# Patient Record
Sex: Male | Born: 1965 | Race: White | Hispanic: No | Marital: Married | Smoking: Current every day smoker
Health system: Southern US, Community
[De-identification: ages and names within clinical notes are randomized; demographics above are authoritative.]

## PROBLEM LIST (undated history)

## (undated) DIAGNOSIS — I10 Essential (primary) hypertension: Secondary | ICD-10-CM

## (undated) DIAGNOSIS — IMO0002 Reserved for concepts with insufficient information to code with codable children: Secondary | ICD-10-CM

## (undated) DIAGNOSIS — N200 Calculus of kidney: Secondary | ICD-10-CM

## (undated) HISTORY — PX: CHOLECYSTECTOMY: SHX55

## (undated) HISTORY — PX: KNEE SURGERY: SHX244

## (undated) HISTORY — PX: BACK SURGERY: SHX140

## (undated) HISTORY — PX: APPENDECTOMY: SHX54

## (undated) HISTORY — PX: BALLOON ANGIOPLASTY, ARTERY: SHX564

---

## 2004-11-16 ENCOUNTER — Emergency Department (HOSPITAL_COMMUNITY): Admission: EM | Admit: 2004-11-16 | Discharge: 2004-11-16 | Payer: Self-pay | Admitting: Emergency Medicine

## 2006-09-05 ENCOUNTER — Emergency Department (HOSPITAL_COMMUNITY): Admission: EM | Admit: 2006-09-05 | Discharge: 2006-09-05 | Payer: Self-pay | Admitting: Emergency Medicine

## 2006-10-14 ENCOUNTER — Emergency Department: Payer: Self-pay | Admitting: Emergency Medicine

## 2007-02-28 ENCOUNTER — Emergency Department (HOSPITAL_COMMUNITY): Admission: EM | Admit: 2007-02-28 | Discharge: 2007-02-28 | Payer: Self-pay | Admitting: Emergency Medicine

## 2007-07-21 ENCOUNTER — Emergency Department (HOSPITAL_COMMUNITY): Admission: EM | Admit: 2007-07-21 | Discharge: 2007-07-21 | Payer: Self-pay | Admitting: Emergency Medicine

## 2009-05-06 ENCOUNTER — Emergency Department (HOSPITAL_COMMUNITY): Admission: EM | Admit: 2009-05-06 | Discharge: 2009-05-07 | Payer: Self-pay | Admitting: Emergency Medicine

## 2009-08-01 ENCOUNTER — Emergency Department (HOSPITAL_COMMUNITY): Admission: EM | Admit: 2009-08-01 | Discharge: 2009-08-01 | Payer: Self-pay | Admitting: Emergency Medicine

## 2009-08-31 ENCOUNTER — Emergency Department (HOSPITAL_COMMUNITY): Admission: EM | Admit: 2009-08-31 | Discharge: 2009-09-01 | Payer: Self-pay | Admitting: Emergency Medicine

## 2009-11-09 ENCOUNTER — Emergency Department (HOSPITAL_COMMUNITY): Admission: EM | Admit: 2009-11-09 | Discharge: 2009-11-09 | Payer: Self-pay | Admitting: Emergency Medicine

## 2010-03-11 ENCOUNTER — Inpatient Hospital Stay (HOSPITAL_COMMUNITY): Admission: EM | Admit: 2010-03-11 | Discharge: 2010-03-12 | Payer: Self-pay | Admitting: Emergency Medicine

## 2010-03-12 ENCOUNTER — Encounter (INDEPENDENT_AMBULATORY_CARE_PROVIDER_SITE_OTHER): Payer: Self-pay | Admitting: Cardiology

## 2010-03-12 ENCOUNTER — Ambulatory Visit: Payer: Self-pay | Admitting: Vascular Surgery

## 2010-03-16 ENCOUNTER — Emergency Department (HOSPITAL_COMMUNITY): Admission: EM | Admit: 2010-03-16 | Discharge: 2010-03-16 | Payer: Self-pay | Admitting: Emergency Medicine

## 2010-04-04 ENCOUNTER — Emergency Department: Payer: Self-pay | Admitting: Internal Medicine

## 2010-11-08 NOTE — Discharge Summary (Signed)
NAME:  Mark Flowers, Mark Flowers NO.:  [id number]  MEDICAL RECORD NO.:  [id number]           PATIENT TYPE:  LOCATION:                                 FACILITY:  PHYSICIAN:  Thereasa Solo. Little, M.D. DATE OF BIRTH:  Feb 26, 1966  DATE OF ADMISSION:  03/11/2010 DATE OF DISCHARGE:  03/12/2010                              DISCHARGE SUMMARY   DISCHARGE DIAGNOSES: 1. Chest pain, negative myocardial infarction.     a.     Patent coronary arteries. 2. Hypertension controlled. 3. Dyslipidemia. 4. Abnormal EKG. 5. Strong family history of coronary artery disease. 6. Negative venous Dopplers for deep vein thrombosis. 7. Tobacco abuse.  DISCHARGE INSTRUCTIONS:  Increase activity slowly.  May shower.  Follow up with Dr. Garen Lah, office will call.  No change in home medications.  HISTORY OF PRESENT ILLNESS:  A 45 year old male was admitted by [facility] and during hospitalization was transferred to [facility].  He had history of chronic pain, both upper and lower back pain with a history of obstructive sleep apnea on CPAP and ongoing tobacco abuse.  He presented to the ER with complaints of chest tightness which have been going off and on for the last 4-5 days that will come at rest as well as with exertion.  Because it continued, he decided to come to the emergency room.  He was in sinus tach.  He was given IV Dilaudid for his pain.  CT angio of the chest showed no acute process, no PE and he was admitted for observation.  Because of the chest pain, it was decided to do a cardiac catheterization.  Past medical history was positive for hypertension, hyperlipidemia, he was on no medication and obstructive sleep apnea which he has CPAP for.  PAST SURGICAL HISTORY:  Cervical spine surgery.  He only takes p.r.n. medication.  MEDICATIONS:  Significant for coronary artery bypass graft in both parents and a sister.  The patient continues to  smoke.  The patient was admitted and cardiac consult was obtained with [facility], was seen by Dr. Garen Lah originally, was placed on heparin and Integrilin.  He had abnormal EKG and underwent cardiac cath on March 12, 2010.  He had smoking cessation consult also during hospitalization.  Cardiac catheterization revealed no acute process, nonobstructive disease and he was felt stable for discharge.  The patient also received Nutrition diet education for his coronary artery disease.  LABORATORY DATA:  At discharge; hemoglobin 13.6, hematocrit 41.2, platelets 235, WBC 9.3, neutrophils 58, lymphs 27, mono 10, eos 4, baso 1.  Protime 1.9, INR 0.88, PTT 25.  Chemistries; sodium 139, potassium 3.9, chloride 106, CO2 29, glucose 88, BUN 13, creatinine 1.33, glycohemoglobin 5.9.  Cardiac enzymes were negative ranging 96-118 with MB 1.7-1.9 and troponin-I 0.01-0.02.  Total cholesterol was 177, triglycerides 159, HDL 43 and LDL 102.  His drug screen was positive for benzodiazepines and opiates.  This may have been after his Dilaudid had been given and he does have chronic back pain.  CT of his chest done on March 11, 2010, no evidence of TEE, no evidence  of aortic dissection, few scattered blebs noted within the lungs with mild dependence of subsegmental atelectasis, tiny nodular densities in the periphery of both lungs measuring up to 3 mm in size.  Followup CT in 1 year was recommended  Portable chest x-ray, no acute cardiopulmonary process was seen.  EKG, sinus tach with a T-wave in V2.     Darcella Gasman. Ingold, N.P.   ______________________________ Thereasa Solo Little, M.D.    LRI/MEDQ  D:  10/31/2010  T:  10/31/2010  Job:  [id number]  cc:   Thereasa Solo. Little, M.D.  Electronically Signed by Nada Boozer N.P. on 11/05/2010 05:40:23 PM Electronically Signed by Julieanne Manson M.D. on 11/08/2010 02:26:18 PM

## 2010-11-16 LAB — T4, FREE: Free T4: 1.16 ng/dL (ref 0.80–1.80)

## 2010-11-16 LAB — CBC
Hemoglobin: 16.5 g/dL (ref 13.0–17.0)
MCH: 31.5 pg (ref 26.0–34.0)
MCV: 91.7 fL (ref 78.0–100.0)
Platelets: 273 10*3/uL (ref 150–400)

## 2010-11-16 LAB — DIFFERENTIAL
Basophils Relative: 2 % — ABNORMAL HIGH (ref 0–1)
Eosinophils Absolute: 0.2 10*3/uL (ref 0.0–0.7)
Monocytes Absolute: 1 10*3/uL (ref 0.1–1.0)
Monocytes Relative: 8 % (ref 3–12)
Neutrophils Relative %: 69 % (ref 43–77)

## 2010-11-16 LAB — POCT CARDIAC MARKERS: CKMB, poc: <1 ng/mL — ABNORMAL LOW (ref 1.0–8.0)

## 2010-11-16 LAB — COMPREHENSIVE METABOLIC PANEL
ALT: 33 U/L (ref 0–53)
AST: 24 U/L (ref 0–37)
Albumin: 4.1 g/dL (ref 3.5–5.2)
Creatinine, Ser: 1.42 mg/dL (ref 0.4–1.5)
GFR calc Af Amer: >60 mL/min (ref >60)
GFR calc non Af Amer: 55 mL/min — ABNORMAL LOW (ref >60)
Glucose, Bld: 212 mg/dL — ABNORMAL HIGH (ref 70–99)
Total Protein: 7 g/dL (ref 6.0–8.3)

## 2010-11-17 LAB — TROPONIN I: Troponin I: 0.01 ng/mL (ref 0.00–0.06)

## 2010-11-17 LAB — CBC
HCT: 41.2 % (ref 39.0–52.0)
HCT: 42.8 % (ref 39.0–52.0)
Hemoglobin: 13.6 g/dL (ref 13.0–17.0)
Hemoglobin: 14.6 g/dL (ref 13.0–17.0)
MCH: 30.2 pg (ref 26.0–34.0)
MCV: 90.9 fL (ref 78.0–100.0)
MCV: 91.8 fL (ref 78.0–100.0)
RBC: 4.49 MIL/uL (ref 4.22–5.81)
WBC: 9.2 10*3/uL (ref 4.0–10.5)

## 2010-11-17 LAB — BASIC METABOLIC PANEL
CO2: 29 mEq/L (ref 19–32)
Calcium: 8.7 mg/dL (ref 8.4–10.5)
Chloride: 108 mEq/L (ref 96–112)
GFR calc non Af Amer: >60 mL/min (ref >60)
Glucose, Bld: 149 mg/dL — ABNORMAL HIGH (ref 70–99)
Glucose, Bld: 88 mg/dL (ref 70–99)
Potassium: 3.7 mEq/L (ref 3.5–5.1)
Sodium: 139 mEq/L (ref 135–145)
Sodium: 139 mEq/L (ref 135–145)

## 2010-11-17 LAB — POCT I-STAT, CHEM 8
Calcium, Ion: 1.06 mmol/L — ABNORMAL LOW (ref 1.12–1.32)
Creatinine, Ser: 1.3 mg/dL (ref 0.4–1.5)
Glucose, Bld: 184 mg/dL — ABNORMAL HIGH (ref 70–99)
HCT: 49 % (ref 39.0–52.0)
Hemoglobin: 16.7 g/dL (ref 13.0–17.0)
Potassium: 3.1 mEq/L — ABNORMAL LOW (ref 3.5–5.1)
TCO2: 25 mmol/L (ref 0–100)

## 2010-11-17 LAB — DIFFERENTIAL
Eosinophils Absolute: 0.3 10*3/uL (ref 0.0–0.7)
Eosinophils Relative: 4 % (ref 0–5)
Lymphocytes Relative: 25 % (ref 12–46)
Lymphs Abs: 2.5 10*3/uL (ref 0.7–4.0)
Monocytes Absolute: 0.8 10*3/uL (ref 0.1–1.0)
Monocytes Relative: 10 % (ref 3–12)
Monocytes Relative: 9 % (ref 3–12)
Neutro Abs: 5.7 10*3/uL (ref 1.7–7.7)
Neutrophils Relative %: 58 % (ref 43–77)

## 2010-11-17 LAB — POCT CARDIAC MARKERS
CKMB, poc: <1 ng/mL — ABNORMAL LOW (ref 1.0–8.0)
CKMB, poc: <1 ng/mL — ABNORMAL LOW (ref 1.0–8.0)
Myoglobin, poc: 34.6 ng/mL (ref 12–200)
Troponin i, poc: <0.05 ng/mL (ref 0.00–0.09)

## 2010-11-17 LAB — LIPID PANEL
HDL: 43 mg/dL (ref >39)
Triglycerides: 159 mg/dL — ABNORMAL HIGH (ref <150)
VLDL: 32 mg/dL (ref 0–40)

## 2010-11-17 LAB — GLUCOSE, CAPILLARY
Glucose-Capillary: 110 mg/dL — ABNORMAL HIGH (ref 70–99)
Glucose-Capillary: 118 mg/dL — ABNORMAL HIGH (ref 70–99)
Glucose-Capillary: 118 mg/dL — ABNORMAL HIGH (ref 70–99)
Glucose-Capillary: 94 mg/dL (ref 70–99)

## 2010-11-17 LAB — HEPARIN LEVEL (UNFRACTIONATED): Heparin Unfractionated: 0.35 IU/mL (ref 0.30–0.70)

## 2010-11-17 LAB — RAPID URINE DRUG SCREEN, HOSP PERFORMED
Cocaine: NOT DETECTED
Opiates: POSITIVE — AB
Tetrahydrocannabinol: NOT DETECTED

## 2010-11-17 LAB — CARDIAC PANEL(CRET KIN+CKTOT+MB+TROPI)
CK, MB: 1.6 ng/mL (ref 0.3–4.0)
CK, MB: 1.9 ng/mL (ref 0.3–4.0)
Relative Index: 1.4 (ref 0.0–2.5)

## 2010-11-17 LAB — CK TOTAL AND CKMB (NOT AT ARMC)
CK, MB: 1.7 ng/mL (ref 0.3–4.0)
Relative Index: INVALID (ref 0.0–2.5)
Total CK: 96 U/L (ref 7–232)

## 2010-11-24 LAB — D-DIMER, QUANTITATIVE: D-Dimer, Quant: <0.22 ug/mL-FEU (ref 0.00–0.48)

## 2010-11-24 LAB — DIFFERENTIAL
Basophils Absolute: 0.2 10*3/uL — ABNORMAL HIGH (ref 0.0–0.1)
Eosinophils Absolute: 0.4 10*3/uL (ref 0.0–0.7)
Eosinophils Relative: 3 % (ref 0–5)

## 2010-11-24 LAB — CBC
HCT: 50.1 % (ref 39.0–52.0)
MCHC: 32.4 g/dL (ref 30.0–36.0)
MCV: 92.8 fL (ref 78.0–100.0)
Platelets: 300 10*3/uL (ref 150–400)
RDW: 13.2 % (ref 11.5–15.5)

## 2010-11-24 LAB — POCT I-STAT, CHEM 8
BUN: 19 mg/dL (ref 6–23)
Calcium, Ion: 1.13 mmol/L (ref 1.12–1.32)
Creatinine, Ser: 1.3 mg/dL (ref 0.4–1.5)
TCO2: 22 mmol/L (ref 0–100)

## 2010-12-06 LAB — CBC
Hemoglobin: 16.4 g/dL (ref 13.0–17.0)
MCHC: 34.7 g/dL (ref 30.0–36.0)
MCV: 91.2 fL (ref 78.0–100.0)
RDW: 14 % (ref 11.5–15.5)

## 2010-12-06 LAB — POCT CARDIAC MARKERS
CKMB, poc: <1 ng/mL — ABNORMAL LOW (ref 1.0–8.0)
CKMB, poc: <1 ng/mL — ABNORMAL LOW (ref 1.0–8.0)
Myoglobin, poc: 60.1 ng/mL (ref 12–200)
Troponin i, poc: <0.05 ng/mL (ref 0.00–0.09)

## 2010-12-06 LAB — BASIC METABOLIC PANEL
CO2: 23 mEq/L (ref 19–32)
Calcium: 9.4 mg/dL (ref 8.4–10.5)
Chloride: 105 mEq/L (ref 96–112)
Creatinine, Ser: 1.39 mg/dL (ref 0.4–1.5)
Glucose, Bld: 134 mg/dL — ABNORMAL HIGH (ref 70–99)
Sodium: 139 mEq/L (ref 135–145)

## 2010-12-06 LAB — DIFFERENTIAL
Basophils Absolute: 0.1 10*3/uL (ref 0.0–0.1)
Basophils Relative: 1 % (ref 0–1)
Eosinophils Absolute: 0.1 10*3/uL (ref 0.0–0.7)
Monocytes Absolute: 0.9 10*3/uL (ref 0.1–1.0)
Neutro Abs: 8.5 10*3/uL — ABNORMAL HIGH (ref 1.7–7.7)

## 2011-06-10 LAB — URINE MICROSCOPIC-ADD ON

## 2011-06-10 LAB — URINALYSIS, ROUTINE W REFLEX MICROSCOPIC
Leukocytes, UA: NEGATIVE
Nitrite: NEGATIVE
Protein, ur: NEGATIVE
Specific Gravity, Urine: 1.01
Urobilinogen, UA: 0.2

## 2011-06-18 LAB — URINALYSIS, ROUTINE W REFLEX MICROSCOPIC
Bilirubin Urine: NEGATIVE
Ketones, ur: NEGATIVE
Leukocytes, UA: NEGATIVE
Nitrite: NEGATIVE
Protein, ur: NEGATIVE
Urobilinogen, UA: 1

## 2011-07-14 ENCOUNTER — Emergency Department (HOSPITAL_COMMUNITY)
Admission: EM | Admit: 2011-07-14 | Discharge: 2011-07-15 | Disposition: A | Discharge status: Home / Self Care | Payer: Medicaid Other | Attending: Emergency Medicine | Admitting: Emergency Medicine

## 2011-07-14 ENCOUNTER — Encounter: Payer: Self-pay | Admitting: Emergency Medicine

## 2011-07-14 DIAGNOSIS — R109 Unspecified abdominal pain: Secondary | ICD-10-CM | POA: Insufficient documentation

## 2011-07-14 DIAGNOSIS — I10 Essential (primary) hypertension: Secondary | ICD-10-CM | POA: Insufficient documentation

## 2011-07-14 DIAGNOSIS — E119 Type 2 diabetes mellitus without complications: Secondary | ICD-10-CM | POA: Insufficient documentation

## 2011-07-14 DIAGNOSIS — Z87442 Personal history of urinary calculi: Secondary | ICD-10-CM | POA: Insufficient documentation

## 2011-07-14 DIAGNOSIS — R112 Nausea with vomiting, unspecified: Secondary | ICD-10-CM | POA: Insufficient documentation

## 2011-07-14 DIAGNOSIS — R319 Hematuria, unspecified: Secondary | ICD-10-CM | POA: Insufficient documentation

## 2011-07-14 HISTORY — DX: Essential (primary) hypertension: I10

## 2011-07-14 HISTORY — DX: Calculus of kidney: N20.0

## 2011-07-14 LAB — URINALYSIS, ROUTINE W REFLEX MICROSCOPIC
Bilirubin Urine: NEGATIVE
Nitrite: NEGATIVE
Specific Gravity, Urine: 1.019 (ref 1.005–1.030)
Urobilinogen, UA: 0.2 mg/dL (ref 0.0–1.0)
pH: 6 (ref 5.0–8.0)

## 2011-07-14 LAB — URINE MICROSCOPIC-ADD ON

## 2011-07-14 MED ORDER — HYDROMORPHONE HCL PF 1 MG/ML IJ SOLN
0.5000 mg | Freq: Once | INTRAMUSCULAR | Status: AC
  Administered 2011-07-15: 0.5 mg via INTRAVENOUS
  Filled 2011-07-14: qty 1

## 2011-07-14 MED ORDER — ONDANSETRON HCL 4 MG/2ML IJ SOLN
4.0000 mg | Freq: Once | INTRAMUSCULAR | Status: AC
  Administered 2011-07-15: 4 mg via INTRAVENOUS
  Filled 2011-07-14: qty 2

## 2011-07-14 NOTE — ED Notes (Signed)
PT. REPORTS RIGHT FLANK PAIN X 2 DAYS ,DENIES INJURY OR FALL, SLIGHT DYSURIA /NO HEMATURIA . NO FEVER OR CHILLS , VOMITTED X2 .

## 2011-07-14 NOTE — ED Notes (Signed)
Pt c/o right flank pain onset 2 days ago.  St's pain increased today.  Pt pacing in hallway

## 2011-07-15 ENCOUNTER — Emergency Department (HOSPITAL_COMMUNITY): Payer: Medicaid Other

## 2011-07-15 ENCOUNTER — Encounter (HOSPITAL_COMMUNITY): Payer: Self-pay | Admitting: Radiology

## 2011-07-15 MED ORDER — HYDROMORPHONE HCL PF 1 MG/ML IJ SOLN
1.0000 mg | Freq: Once | INTRAMUSCULAR | Status: AC
  Administered 2011-07-15: 1 mg via INTRAVENOUS
  Filled 2011-07-15: qty 1

## 2011-07-15 MED ORDER — OXYCODONE-ACETAMINOPHEN 5-325 MG PO TABS: 2.0000 | ORAL_TABLET | ORAL | Status: AC | PRN

## 2011-07-15 MED ORDER — PROMETHAZINE HCL 25 MG RE SUPP: 25.0000 mg | Freq: Four times a day (QID) | RECTAL | Status: AC | PRN

## 2011-07-15 MED ORDER — KETOROLAC TROMETHAMINE 30 MG/ML IJ SOLN
30.0000 mg | Freq: Once | INTRAMUSCULAR | Status: AC
  Administered 2011-07-15: 30 mg via INTRAVENOUS
  Filled 2011-07-15: qty 1

## 2011-07-15 MED ORDER — PROMETHAZINE HCL 25 MG PO TABS: 25.0000 mg | ORAL_TABLET | Freq: Four times a day (QID) | ORAL | Status: AC | PRN

## 2011-07-15 MED ORDER — ONDANSETRON HCL 4 MG PO TABS: 4.0000 mg | ORAL_TABLET | Freq: Four times a day (QID) | ORAL | Status: AC

## 2011-07-15 NOTE — ED Provider Notes (Signed)
History     CSN: [id number] Arrival date & time: 07/14/2011  7:34 PM   First MD Initiated Contact with Patient 07/14/11 2332      Chief Complaint  Patient presents with  . Flank Pain    (Consider location/radiation/quality/duration/timing/severity/associated sxs/prior treatment) HPI 45 year old male presents to emergency room with complaint of right flank pain for last 2 days. Patient denies any injury or fall, pain not reproducible with palpation or movement. Pain is similar to prior kidney stone pain. Patient has had nausea and vomiting with this pain when it is severe. Patient is taking ibuprofen without improvement in pain no fevers. No pain with urination but has noted dark discoloration to his urine. Past Medical History  Diagnosis Date  . Kidney calculi   . Diabetes mellitus   . Hypertension     Past Surgical History  Procedure Date  . Back surgery   . Appendectomy   . Cholecystectomy   . Balloon angioplasty, artery     No family history on file.  History  Substance Use Topics  . Smoking status: Current Everyday Smoker  . Smokeless tobacco: Not on file  . Alcohol Use: No      Review of Systems  Constitutional: Negative.   HENT: Negative.   Eyes: Negative.   Respiratory: Negative.   Cardiovascular: Negative.   Gastrointestinal: Negative.   Genitourinary: Negative.   Musculoskeletal: Negative.   Skin: Negative.   Neurological: Negative.   Hematological: Negative.   Psychiatric/Behavioral: Negative.   All other systems reviewed and are negative.    Allergies  Bee venom; Morphine and related; and Flexeril  Home Medications   Current Outpatient Rx  Name Route Sig Dispense Refill  . ASPIRIN 81 MG PO TABS Oral Take 81 mg by mouth every morning.      . CHOLESTYRAMINE 4 G PO PACK Oral Take 1 packet by mouth daily.      Marland Kitchen GABAPENTIN 300 MG PO CAPS Oral Take 300-600 mg by mouth 3 (three) times daily. The range depends on the amount of pain.     Marland Kitchen  GLIMEPIRIDE 2 MG PO TABS Oral Take 2 mg by mouth 3 (three) times daily.      . IBUPROFEN 800 MG PO TABS Oral Take 800 mg by mouth every 6 (six) hours as needed. For pain.     Marland Kitchen OMEPRAZOLE 20 MG PO CPDR Oral Take 20 mg by mouth 2 (two) times daily.      Marland Kitchen TIZANIDINE HCL 4 MG PO TABS Oral Take 2-4 mg by mouth at bedtime.        BP 119/94  Pulse 100  Temp(Src) 97.6 F (36.4 C) (Oral)  Resp 20  SpO2 97%  Physical Exam  Nursing note and vitals reviewed. Constitutional: He is oriented to person, place, and time. He appears well-developed and well-nourished.       Uncomfortable appearing, pacing  HENT:  Head: Normocephalic and atraumatic.  Right Ear: External ear normal.  Left Ear: External ear normal.  Nose: Nose normal.  Mouth/Throat: Oropharynx is clear and moist.  Eyes: Conjunctivae and EOM are normal. Pupils are equal, round, and reactive to light.  Neck: Normal range of motion. Neck supple. No JVD present. No tracheal deviation present. No thyromegaly present.  Cardiovascular: Normal rate, regular rhythm, normal heart sounds and intact distal pulses.  Exam reveals no gallop and no friction rub.   No murmur heard. Pulmonary/Chest: Effort normal and breath sounds normal. No stridor. No respiratory distress. He has  no wheezes. He has no rales. He exhibits no tenderness.  Abdominal: Soft. Bowel sounds are normal. He exhibits no distension and no mass. There is no tenderness. There is no rebound and no guarding.       CVA tenderness on right  Musculoskeletal: Normal range of motion. He exhibits no edema and no tenderness.  Lymphadenopathy:    He has no cervical adenopathy.  Neurological: He is oriented to person, place, and time. He has normal reflexes. No cranial nerve deficit. He exhibits normal muscle tone. Coordination normal.  Skin: Skin is dry. No rash noted. No erythema. No pallor.  Psychiatric: He has a normal mood and affect. His behavior is normal. Judgment and thought content  normal.    ED Course  Procedures (including critical care time)  Labs Reviewed  URINALYSIS, ROUTINE W REFLEX MICROSCOPIC - Abnormal; Notable for the following:    Hgb urine dipstick LARGE (*)    All other components within normal limits  URINE MICROSCOPIC-ADD ON   Ct Abdomen Pelvis Wo Contrast  07/15/2011  *RADIOLOGY REPORT*  Clinical Data: 45 year old male with right flank pain.  Vomiting.  CT ABDOMEN AND PELVIS WITHOUT CONTRAST  Technique:  Multidetector CT imaging of the abdomen and pelvis was performed following the standard protocol without intravenous contrast.  Comparison: 02/28/2007.  Findings: Mild right basilar atelectasis. No acute osseous abnormality identified.  No pelvic free fluid.  Negative distal colon.  Large and small bowel loops largely are decompressed. Appendix not identified, may be surgically absent.  Gallbladder surgically absent.  Negative noncontrast stomach, duodenum, liver, spleen, pancreas, and adrenal glands.  Punctate bilateral nephrolithiasis.  No hydronephrosis, perinephric stranding, or hydroureter.  Normal course of the right ureter.  No abdominal free fluid.  IMPRESSION: Punctate bilateral nephrolithiasis without urologic obstruction or other acute finding in the abdomen or pelvis.  Original Report Authenticated By: Harley Hallmark, M.D.        MDM  45 year old male with right flank pain and hematuria, CT scan with punctate bilateral nephrolithiasis but no signs of acute kidney stone or hydronephrosis. Patient feeling better after pain medication. Patient to follow up with urology for further workup of his hematuria and flank pain        Olivia Mackie, MD 07/15/11 [phone]

## 2011-07-15 NOTE — ED Notes (Signed)
Pain med given, pt waiting for CT.  Pt undressed and placed in gown.

## 2011-08-22 ENCOUNTER — Emergency Department (HOSPITAL_COMMUNITY)
Admission: EM | Admit: 2011-08-22 | Discharge: 2011-08-22 | Disposition: A | Discharge status: Home / Self Care | Payer: Medicaid Other | Attending: Emergency Medicine | Admitting: Emergency Medicine

## 2011-08-22 ENCOUNTER — Emergency Department (HOSPITAL_COMMUNITY): Payer: Medicaid Other

## 2011-08-22 ENCOUNTER — Encounter (HOSPITAL_COMMUNITY): Payer: Self-pay | Admitting: Emergency Medicine

## 2011-08-22 DIAGNOSIS — R110 Nausea: Secondary | ICD-10-CM | POA: Insufficient documentation

## 2011-08-22 DIAGNOSIS — R319 Hematuria, unspecified: Secondary | ICD-10-CM | POA: Insufficient documentation

## 2011-08-22 DIAGNOSIS — Z7982 Long term (current) use of aspirin: Secondary | ICD-10-CM | POA: Insufficient documentation

## 2011-08-22 DIAGNOSIS — E119 Type 2 diabetes mellitus without complications: Secondary | ICD-10-CM | POA: Insufficient documentation

## 2011-08-22 DIAGNOSIS — R1032 Left lower quadrant pain: Secondary | ICD-10-CM | POA: Insufficient documentation

## 2011-08-22 DIAGNOSIS — R61 Generalized hyperhidrosis: Secondary | ICD-10-CM | POA: Insufficient documentation

## 2011-08-22 DIAGNOSIS — IMO0002 Reserved for concepts with insufficient information to code with codable children: Secondary | ICD-10-CM | POA: Insufficient documentation

## 2011-08-22 DIAGNOSIS — Z9889 Other specified postprocedural states: Secondary | ICD-10-CM | POA: Insufficient documentation

## 2011-08-22 DIAGNOSIS — R10819 Abdominal tenderness, unspecified site: Secondary | ICD-10-CM | POA: Insufficient documentation

## 2011-08-22 DIAGNOSIS — Z79899 Other long term (current) drug therapy: Secondary | ICD-10-CM | POA: Insufficient documentation

## 2011-08-22 DIAGNOSIS — I10 Essential (primary) hypertension: Secondary | ICD-10-CM | POA: Insufficient documentation

## 2011-08-22 DIAGNOSIS — R197 Diarrhea, unspecified: Secondary | ICD-10-CM | POA: Insufficient documentation

## 2011-08-22 HISTORY — DX: Reserved for concepts with insufficient information to code with codable children: IMO0002

## 2011-08-22 LAB — BASIC METABOLIC PANEL
BUN: 12 mg/dL (ref 6–23)
Calcium: 9.3 mg/dL (ref 8.4–10.5)
Chloride: 103 mEq/L (ref 96–112)
Creatinine, Ser: 1.11 mg/dL (ref 0.50–1.35)
GFR calc Af Amer: >90 mL/min (ref >90)
GFR calc non Af Amer: 80 mL/min — ABNORMAL LOW (ref >90)

## 2011-08-22 LAB — CBC
HCT: 45.6 % (ref 39.0–52.0)
Hemoglobin: 15.6 g/dL (ref 13.0–17.0)
MCH: 30.5 pg (ref 26.0–34.0)
MCHC: 34.2 g/dL (ref 30.0–36.0)
RDW: 13.8 % (ref 11.5–15.5)

## 2011-08-22 LAB — URINALYSIS, ROUTINE W REFLEX MICROSCOPIC
Nitrite: NEGATIVE
Protein, ur: NEGATIVE mg/dL
Specific Gravity, Urine: 1.016 (ref 1.005–1.030)
Urobilinogen, UA: 0.2 mg/dL (ref 0.0–1.0)

## 2011-08-22 LAB — DIFFERENTIAL
Basophils Absolute: 0.1 10*3/uL (ref 0.0–0.1)
Basophils Relative: 1 % (ref 0–1)
Eosinophils Absolute: 0.1 10*3/uL (ref 0.0–0.7)
Monocytes Absolute: 0.8 10*3/uL (ref 0.1–1.0)
Monocytes Relative: 7 % (ref 3–12)
Neutro Abs: 7.5 10*3/uL (ref 1.7–7.7)
Neutrophils Relative %: 71 % (ref 43–77)

## 2011-08-22 LAB — URINE MICROSCOPIC-ADD ON

## 2011-08-22 MED ORDER — SODIUM CHLORIDE 0.9 % IV BOLUS (SEPSIS)
1000.0000 mL | Freq: Once | INTRAVENOUS | Status: AC
  Administered 2011-08-22: 1000 mL via INTRAVENOUS

## 2011-08-22 MED ORDER — ONDANSETRON HCL 4 MG/2ML IJ SOLN
4.0000 mg | Freq: Once | INTRAMUSCULAR | Status: AC
  Administered 2011-08-22: 4 mg via INTRAVENOUS
  Filled 2011-08-22: qty 2

## 2011-08-22 MED ORDER — METRONIDAZOLE 500 MG PO TABS: 500.0000 mg | ORAL_TABLET | Freq: Two times a day (BID) | ORAL | Status: AC

## 2011-08-22 MED ORDER — CIPROFLOXACIN HCL 500 MG PO TABS: 500.0000 mg | ORAL_TABLET | Freq: Two times a day (BID) | ORAL | Status: AC

## 2011-08-22 MED ORDER — HYDROMORPHONE HCL PF 1 MG/ML IJ SOLN
1.0000 mg | Freq: Once | INTRAMUSCULAR | Status: AC
  Administered 2011-08-22: 1 mg via INTRAVENOUS
  Filled 2011-08-22: qty 1

## 2011-08-22 NOTE — ED Provider Notes (Signed)
History     CSN: [id number]  Arrival date & time 08/22/11  1704   First MD Initiated Contact with Patient 08/22/11 1959      Chief Complaint  Patient presents with  . Abdominal Pain    rlq    (Consider location/radiation/quality/duration/timing/severity/associated sxs/prior treatment) HPI Comments: Patient here with acute onset of RLQ abdominal pain that intially started yesterday, states that during the night, this pain eased off, then returned again suddenly today at 3pm - he states that when the pain is at it's worse he is sweaty and nauseous.  States had diarrhea about 2 days ago - but reports normal bowel movements since then.  Denies fever, chills, blood in urine, pain with urination.  Patient is a 45 y.o. male presenting with abdominal pain. The history is provided by the patient. No language interpreter was used.  Abdominal Pain The primary symptoms of the illness include abdominal pain and nausea. The primary symptoms of the illness do not include fever, shortness of breath, vomiting, diarrhea, hematemesis, hematochezia or dysuria. The current episode started yesterday. The onset of the illness was sudden. The problem has not changed since onset. The patient states that she believes she is currently not pregnant. The patient has not had a change in bowel habit. Additional symptoms associated with the illness include diaphoresis. Symptoms associated with the illness do not include chills, anorexia, heartburn, constipation, urgency, hematuria, frequency or back pain.    Past Medical History  Diagnosis Date  . Kidney calculi   . Diabetes mellitus   . Hypertension   . Degenerative disc disease     Past Surgical History  Procedure Date  . Back surgery   . Appendectomy   . Cholecystectomy   . Balloon angioplasty, artery     No family history on file.  History  Substance Use Topics  . Smoking status: Current Everyday Smoker -- 1.0 packs/day  . Smokeless tobacco: Not on  file  . Alcohol Use: No      Review of Systems  Constitutional: Positive for diaphoresis. Negative for fever and chills.  Respiratory: Negative for shortness of breath.   Gastrointestinal: Positive for nausea and abdominal pain. Negative for heartburn, vomiting, diarrhea, constipation, hematochezia, anorexia and hematemesis.  Genitourinary: Negative for dysuria, urgency, frequency and hematuria.  Musculoskeletal: Negative for back pain.  All other systems reviewed and are negative.    Allergies  Bee venom; Morphine and related; and Flexeril  Home Medications   Current Outpatient Rx  Name Route Sig Dispense Refill  . ASPIRIN 81 MG PO TABS Oral Take 81 mg by mouth every morning.      Marland Kitchen DICLOFENAC SODIUM 75 MG PO TBEC Oral Take 75 mg by mouth daily.      Marland Kitchen DILTIAZEM HCL ER COATED BEADS 120 MG PO CP24 Oral Take 120 mg by mouth daily.      Marland Kitchen GABAPENTIN 300 MG PO CAPS Oral Take 600 mg by mouth 4 (four) times daily - after meals and at bedtime. The range depends on the amount of pain.    Marland Kitchen GLIMEPIRIDE 2 MG PO TABS Oral Take 2 mg by mouth 2 (two) times daily.     Marland Kitchen HYDROCODONE-ACETAMINOPHEN 10-500 MG PO TABS Oral Take 1 tablet by mouth every 6 (six) hours as needed. pain     . IBUPROFEN 800 MG PO TABS Oral Take 800 mg by mouth every 6 (six) hours as needed. For pain.     Marland Kitchen OMEPRAZOLE 20 MG PO CPDR  Oral Take 20 mg by mouth 2 (two) times daily.      . SUMATRIPTAN SUCCINATE 50 MG PO TABS Oral Take 50 mg by mouth every 2 (two) hours as needed. migraine       BP 159/97  Pulse 105  Temp(Src) 98.2 F (36.8 C) (Oral)  Resp 20  Wt 296 lb (134.265 kg)  SpO2 100%  Physical Exam  Nursing note and vitals reviewed. Constitutional: He is oriented to person, place, and time. He appears well-developed and well-nourished. No distress.  HENT:  Head: Normocephalic and atraumatic.  Right Ear: External ear normal.  Left Ear: External ear normal.  Mouth/Throat: No oropharyngeal exudate.  Eyes:  Conjunctivae are normal. Pupils are equal, round, and reactive to light. No scleral icterus.  Neck: Normal range of motion. Neck supple.  Cardiovascular: Normal rate, regular rhythm and normal heart sounds.  Exam reveals no gallop and no friction rub.   No murmur heard. Pulmonary/Chest: Effort normal and breath sounds normal. No respiratory distress.  Abdominal: Soft. Bowel sounds are normal. He exhibits no distension. There is tenderness. There is no rebound, no guarding and no CVA tenderness.  Musculoskeletal: Normal range of motion.  Neurological: He is alert and oriented to person, place, and time. No cranial nerve deficit.  Skin: Skin is warm and dry.  Psychiatric: He has a normal mood and affect. His behavior is normal. Judgment and thought content normal.    ED Course  Procedures (including critical care time)  Labs Reviewed  URINALYSIS, ROUTINE W REFLEX MICROSCOPIC - Abnormal; Notable for the following:    Hgb urine dipstick MODERATE (*)    All other components within normal limits  URINE MICROSCOPIC-ADD ON  CBC  DIFFERENTIAL  BASIC METABOLIC PANEL   Ct Abdomen Pelvis Wo Contrast  08/22/2011  *RADIOLOGY REPORT*  Clinical Data: Right abdominal groin pain, nausea, history kidney stones, diabetes, hypertension, appendectomy, back surgery, cholecystectomy  CT ABDOMEN AND PELVIS WITHOUT CONTRAST  Technique:  Multidetector CT imaging of the abdomen and pelvis was performed following the standard protocol without intravenous contrast. Sagittal and coronal MPR images reconstructed from axial data set.  Comparison: 07/15/2011  Findings: Lung bases clear. Gallbladder surgically absent. Tiny bilateral nonobstructing renal calculi. No hydronephrosis, ureteral calcification, or ureteral dilatation. Bladder normal appearance.  Within limits of a nonenhanced exam, no focal abnormalities of the liver, spleen, pancreas, kidneys, or adrenal glands otherwise identified. Stomach and bowel loops normal  appearance. Left inguinal canal mildly distended by fat, question tiny left inguinal hernia. No findings identified to account for patient's right sided abdominal symptoms. Bones unremarkable.  IMPRESSION: Tiny bilateral nonobstructing renal calculi. Question tiny left inguinal hernia containing fat. No cause for right side abdominal symptoms identified.  Original Report Authenticated By: Lollie Marrow, M.D.     Abdominal pain Hematuria Diarrhea    MDM  There are punctate stones still in the kidney without signs of ureteral stones, bowel appears normal in CT scan, though this could be colitis - plan to place on cipro and flagyl for antibiotics and patient will follow up with PCP         Izola Price. [place], [place] 08/22/11 2147

## 2011-08-22 NOTE — ED Notes (Signed)
Patient sts that he started w/ pain yesterday and it has gotten worse and pain had moved to right mid quadrant.  + for nausea.   IVF bolus infusing

## 2011-08-22 NOTE — ED Notes (Signed)
Patient returned from CT decreased amount of pain prior to CT after mdilaudid

## 2011-08-22 NOTE — ED Provider Notes (Signed)
Medical screening examination/treatment/procedure(s) were performed by non-physician practitioner and as supervising physician I was immediately available for consultation/collaboration.   Mykael Trott, MD 08/22/11 2328 

## 2011-08-22 NOTE — ED Notes (Signed)
Per pt, "I started having right lower quadrant pain last night around 10.  The pain was much worse yesterday but it's still going on today and I want it checked out." Pt has had appy

## 2011-08-26 ENCOUNTER — Encounter (HOSPITAL_COMMUNITY): Payer: Self-pay | Admitting: *Deleted

## 2011-08-26 ENCOUNTER — Emergency Department (HOSPITAL_COMMUNITY): Payer: Medicaid Other

## 2011-08-26 ENCOUNTER — Emergency Department (HOSPITAL_COMMUNITY)
Admission: EM | Admit: 2011-08-26 | Discharge: 2011-08-27 | Disposition: A | Discharge status: Home / Self Care | Payer: Medicaid Other | Attending: Emergency Medicine | Admitting: Emergency Medicine

## 2011-08-26 DIAGNOSIS — Z7982 Long term (current) use of aspirin: Secondary | ICD-10-CM | POA: Insufficient documentation

## 2011-08-26 DIAGNOSIS — G8929 Other chronic pain: Secondary | ICD-10-CM

## 2011-08-26 DIAGNOSIS — R112 Nausea with vomiting, unspecified: Secondary | ICD-10-CM | POA: Insufficient documentation

## 2011-08-26 DIAGNOSIS — I10 Essential (primary) hypertension: Secondary | ICD-10-CM | POA: Insufficient documentation

## 2011-08-26 DIAGNOSIS — R197 Diarrhea, unspecified: Secondary | ICD-10-CM | POA: Insufficient documentation

## 2011-08-26 DIAGNOSIS — R1031 Right lower quadrant pain: Secondary | ICD-10-CM | POA: Insufficient documentation

## 2011-08-26 DIAGNOSIS — Z79899 Other long term (current) drug therapy: Secondary | ICD-10-CM | POA: Insufficient documentation

## 2011-08-26 DIAGNOSIS — E119 Type 2 diabetes mellitus without complications: Secondary | ICD-10-CM | POA: Insufficient documentation

## 2011-08-26 DIAGNOSIS — R109 Unspecified abdominal pain: Secondary | ICD-10-CM

## 2011-08-26 LAB — CBC
Hemoglobin: 16.2 g/dL (ref 13.0–17.0)
MCH: 31.2 pg (ref 26.0–34.0)
MCHC: 35.4 g/dL (ref 30.0–36.0)
Platelets: 270 10*3/uL (ref 150–400)
RDW: 13.8 % (ref 11.5–15.5)

## 2011-08-26 LAB — COMPREHENSIVE METABOLIC PANEL
Albumin: 4.2 g/dL (ref 3.5–5.2)
BUN: 14 mg/dL (ref 6–23)
Calcium: 9.9 mg/dL (ref 8.4–10.5)
Chloride: 104 mEq/L (ref 96–112)
Creatinine, Ser: 1.14 mg/dL (ref 0.50–1.35)
GFR calc non Af Amer: 77 mL/min — ABNORMAL LOW (ref >90)
Total Bilirubin: 0.3 mg/dL (ref 0.3–1.2)

## 2011-08-26 LAB — DIFFERENTIAL
Basophils Absolute: 0.1 10*3/uL (ref 0.0–0.1)
Basophils Relative: 0 % (ref 0–1)
Eosinophils Absolute: 0.1 10*3/uL (ref 0.0–0.7)
Monocytes Relative: 9 % (ref 3–12)
Neutro Abs: 8.2 10*3/uL — ABNORMAL HIGH (ref 1.7–7.7)
Neutrophils Relative %: 73 % (ref 43–77)

## 2011-08-26 LAB — URINALYSIS, ROUTINE W REFLEX MICROSCOPIC
Bilirubin Urine: NEGATIVE
Ketones, ur: NEGATIVE mg/dL
Leukocytes, UA: NEGATIVE
Nitrite: NEGATIVE
Protein, ur: NEGATIVE mg/dL
Urobilinogen, UA: 0.2 mg/dL (ref 0.0–1.0)

## 2011-08-26 LAB — LIPASE, BLOOD: Lipase: 22 U/L (ref 11–59)

## 2011-08-26 LAB — URINE MICROSCOPIC-ADD ON

## 2011-08-26 MED ORDER — HYDROMORPHONE HCL PF 2 MG/ML IJ SOLN
2.0000 mg | Freq: Once | INTRAMUSCULAR | Status: AC
  Administered 2011-08-26: 2 mg via INTRAVENOUS
  Filled 2011-08-26: qty 1

## 2011-08-26 MED ORDER — SODIUM CHLORIDE 0.9 % IV BOLUS (SEPSIS)
1000.0000 mL | Freq: Once | INTRAVENOUS | Status: AC
  Administered 2011-08-26: 1000 mL via INTRAVENOUS

## 2011-08-26 MED ORDER — DIPHENHYDRAMINE HCL 50 MG/ML IJ SOLN
50.0000 mg | Freq: Once | INTRAMUSCULAR | Status: AC
  Administered 2011-08-26: 50 mg via INTRAVENOUS
  Filled 2011-08-26: qty 1

## 2011-08-26 MED ORDER — ONDANSETRON HCL 4 MG/2ML IJ SOLN
4.0000 mg | Freq: Once | INTRAMUSCULAR | Status: AC
  Administered 2011-08-26: 4 mg via INTRAVENOUS
  Filled 2011-08-26: qty 2

## 2011-08-26 MED ORDER — HYDROMORPHONE HCL PF 2 MG/ML IJ SOLN
2.0000 mg | Freq: Once | INTRAMUSCULAR | Status: AC
Start: 2011-08-26 — End: 2011-08-26
  Administered 2011-08-26: 2 mg via INTRAVENOUS
  Filled 2011-08-26: qty 1

## 2011-08-26 NOTE — ED Notes (Signed)
Pt reports being seen for same complaints twice in last month. C/o RLQ pain, sharp and achy. Sts everytime after he eats, about 1 hour later he has n/v and pain.

## 2011-08-26 NOTE — ED Provider Notes (Addendum)
History     CSN: [id number]  Arrival date & time 08/26/11  4696   First MD Initiated Contact with Patient 08/26/11 1947      Chief Complaint  Patient presents with  . Abdominal Pain    (Consider location/radiation/quality/duration/timing/severity/associated sxs/prior treatment) HPI Complains of right lower quadrant pain for one month worse with eating. Pain is nonradiating patient had one episode of vomiting today and one episode of diarrhea the pain became worse today. Seen here 08/22/2011 for same complaint had CT scan of abdomen and pelvis showing nonobstructing renal calculi no explanation for pain. Etiology of pain has as of yet an undetermined pain does not feel like kidney stone he's had in the past. No other associated symptoms Past Medical History  Diagnosis Date  . Kidney calculi   . Diabetes mellitus   . Hypertension   . Degenerative disc disease     Past Surgical History  Procedure Date  . Back surgery   . Appendectomy   . Cholecystectomy   . Balloon angioplasty, artery   chronic back pain No family history on file.  History  Substance Use Topics  . Smoking status: Current Everyday Smoker -- 1.0 packs/day  . Smokeless tobacco: Not on file  . Alcohol Use: No      Review of Systems  Constitutional: Negative.   HENT: Negative.   Respiratory: Negative.   Cardiovascular: Negative.   Gastrointestinal: Positive for nausea, vomiting, abdominal pain and diarrhea. Negative for blood in stool.  Musculoskeletal: Negative.   Skin: Negative.   Neurological: Negative.   Hematological: Negative.   Psychiatric/Behavioral: Negative.     Allergies  Bee venom; Morphine and related; and Flexeril  Home Medications   Current Outpatient Rx  Name Route Sig Dispense Refill  . ASPIRIN 81 MG PO TABS Oral Take 81 mg by mouth every morning.      Marland Kitchen CIPROFLOXACIN HCL 500 MG PO TABS Oral Take 1 tablet (500 mg total) by mouth every 12 (twelve) hours. 14 tablet 0  .  DICLOFENAC SODIUM 75 MG PO TBEC Oral Take 75 mg by mouth daily.      Marland Kitchen DILTIAZEM HCL ER COATED BEADS 120 MG PO CP24 Oral Take 120 mg by mouth daily.      Marland Kitchen GABAPENTIN 300 MG PO CAPS Oral Take 600 mg by mouth 4 (four) times daily - after meals and at bedtime. The range depends on the amount of pain.    Marland Kitchen GLIMEPIRIDE 2 MG PO TABS Oral Take 2 mg by mouth 2 (two) times daily.     Marland Kitchen HYDROCODONE-ACETAMINOPHEN 10-500 MG PO TABS Oral Take 1 tablet by mouth every 6 (six) hours as needed. pain     . IBUPROFEN 800 MG PO TABS Oral Take 800 mg by mouth every 6 (six) hours as needed. For pain.     Marland Kitchen METRONIDAZOLE 500 MG PO TABS Oral Take 1 tablet (500 mg total) by mouth 2 (two) times daily. 14 tablet 0  . OMEPRAZOLE 20 MG PO CPDR Oral Take 20 mg by mouth 2 (two) times daily.      . SUMATRIPTAN SUCCINATE 50 MG PO TABS Oral Take 50 mg by mouth every 2 (two) hours as needed. migraine       BP 147/108  Pulse 108  Temp(Src) 98.5 F (36.9 C) (Oral)  Resp 20  SpO2 100%  Physical Exam  Constitutional: He appears well-developed and well-nourished. He appears distressed.       Appears uncomfortable  HENT:  Head: Normocephalic and atraumatic.  Eyes: Conjunctivae are normal. Pupils are equal, round, and reactive to light.  Neck: Neck supple. No tracheal deviation present. No thyromegaly present.  Cardiovascular: Normal rate and regular rhythm.   No murmur heard. Pulmonary/Chest: Effort normal and breath sounds normal.  Abdominal: Soft. Bowel sounds are normal. He exhibits no distension and no mass. There is tenderness. There is no rebound and no guarding.       Minimally tender right lower  Genitourinary: Penis normal.  Musculoskeletal: Normal range of motion. He exhibits no edema and no tenderness.  Neurological: He is alert. Coordination normal.  Skin: Skin is warm and dry. No rash noted.  Psychiatric: He has a normal mood and affect.    ED Course  Procedures (including critical care time) Pain  improved after treatment with hydromorphone. Complaint of itching at 20 2:15 PM Benadryl ordered by me at 20 3:40 PM pain and itching is much improved. Patient feels ready to go home  Labs Reviewed  URINALYSIS, ROUTINE W REFLEX MICROSCOPIC  CBC  DIFFERENTIAL  BASIC METABOLIC PANEL   No results found.   No diagnosis found.  Results for orders placed during the hospital encounter of 08/26/11  URINALYSIS, ROUTINE W REFLEX MICROSCOPIC      Component Value Range   Color, Urine YELLOW  YELLOW    APPearance CLEAR  CLEAR    Specific Gravity, Urine 1.018  1.005 - 1.030    pH 6.0  5.0 - 8.0    Glucose, UA NEGATIVE  NEGATIVE (mg/dL)   Hgb urine dipstick MODERATE (*) NEGATIVE    Bilirubin Urine NEGATIVE  NEGATIVE    Ketones, ur NEGATIVE  NEGATIVE (mg/dL)   Protein, ur NEGATIVE  NEGATIVE (mg/dL)   Urobilinogen, UA 0.2  0.0 - 1.0 (mg/dL)   Nitrite NEGATIVE  NEGATIVE    Leukocytes, UA NEGATIVE  NEGATIVE   CBC      Component Value Range   WBC 11.2 (*) 4.0 - 10.5 (K/uL)   RBC 5.19  4.22 - 5.81 (MIL/uL)   Hemoglobin 16.2  13.0 - 17.0 (g/dL)   HCT 96.0  45.4 - 09.8 (%)   MCV 88.1  78.0 - 100.0 (fL)   MCH 31.2  26.0 - 34.0 (pg)   MCHC 35.4  30.0 - 36.0 (g/dL)   RDW 11.9  14.7 - 82.9 (%)   Platelets 270  150 - 400 (K/uL)  DIFFERENTIAL      Component Value Range   Neutrophils Relative 73  43 - 77 (%)   Neutro Abs 8.2 (*) 1.7 - 7.7 (K/uL)   Lymphocytes Relative 18  12 - 46 (%)   Lymphs Abs 2.0  0.7 - 4.0 (K/uL)   Monocytes Relative 9  3 - 12 (%)   Monocytes Absolute 1.0  0.1 - 1.0 (K/uL)   Eosinophils Relative 1  0 - 5 (%)   Eosinophils Absolute 0.1  0.0 - 0.7 (K/uL)   Basophils Relative 0  0 - 1 (%)   Basophils Absolute 0.1  0.0 - 0.1 (K/uL)  URINE MICROSCOPIC-ADD ON      Component Value Range   Squamous Epithelial / LPF RARE  RARE    RBC / HPF 3-6  <3 (RBC/hpf)   Urine-Other MUCOUS PRESENT    COMPREHENSIVE METABOLIC PANEL      Component Value Range   Sodium 139  135 - 145 (mEq/L)    Potassium 3.4 (*) 3.5 - 5.1 (mEq/L)   Chloride 104  96 - 112 (  mEq/L)   CO2 22  19 - 32 (mEq/L)   Glucose, Bld 108 (*) 70 - 99 (mg/dL)   BUN 14  6 - 23 (mg/dL)   Creatinine, Ser 1.61  0.50 - 1.35 (mg/dL)   Calcium 9.9  8.4 - 09.6 (mg/dL)   Total Protein 7.7  6.0 - 8.3 (g/dL)   Albumin 4.2  3.5 - 5.2 (g/dL)   AST 13  0 - 37 (U/L)   ALT 24  0 - 53 (U/L)   Alkaline Phosphatase 61  39 - 117 (U/L)   Total Bilirubin 0.3  0.3 - 1.2 (mg/dL)   GFR calc non Af Amer 77 (*) >90 (mL/min)   GFR calc Af Amer 90 (*) >90 (mL/min)  LIPASE, BLOOD      Component Value Range   Lipase 22  11 - 59 (U/L)   Ct Abdomen Pelvis Wo Contrast  08/22/2011  *RADIOLOGY REPORT*  Clinical Data: Right abdominal groin pain, nausea, history kidney stones, diabetes, hypertension, appendectomy, back surgery, cholecystectomy  CT ABDOMEN AND PELVIS WITHOUT CONTRAST  Technique:  Multidetector CT imaging of the abdomen and pelvis was performed following the standard protocol without intravenous contrast. Sagittal and coronal MPR images reconstructed from axial data set.  Comparison: 07/15/2011  Findings: Lung bases clear. Gallbladder surgically absent. Tiny bilateral nonobstructing renal calculi. No hydronephrosis, ureteral calcification, or ureteral dilatation. Bladder normal appearance.  Within limits of a nonenhanced exam, no focal abnormalities of the liver, spleen, pancreas, kidneys, or adrenal glands otherwise identified. Stomach and bowel loops normal appearance. Left inguinal canal mildly distended by fat, question tiny left inguinal hernia. No findings identified to account for patient's right sided abdominal symptoms. Bones unremarkable.  IMPRESSION: Tiny bilateral nonobstructing renal calculi. Question tiny left inguinal hernia containing fat. No cause for right side abdominal symptoms identified.  Original Report Authenticated By: Lollie Marrow, M.D.   Dg Abd Acute W/chest  08/26/2011  *RADIOLOGY REPORT*  Clinical Data:  Right lower quadrant abdominal pain for 2 days; dysuria, nausea and vomiting.  ACUTE ABDOMEN SERIES (ABDOMEN 2 VIEW & CHEST 1 VIEW)  Comparison: Abdominal radiograph performed 07/21/2007, chest radiograph performed 03/16/2010, and CT of the abdomen and pelvis performed 08/22/2011  Findings: The lungs are well-aerated and clear.  There is no evidence of focal opacification, pleural effusion or pneumothorax. The cardiomediastinal silhouette is within normal limits.  The visualized bowel gas pattern is unremarkable. Stool and air are noted throughout the colon; there is no evidence of small bowel dilatation to suggest obstruction.  No free intra-abdominal air is identified on the provided upright view.  Clips are noted within the right upper quadrant, reflecting prior cholecystectomy.  Known tiny bilateral renal stones are better characterized on prior CT.  No acute osseous abnormalities are seen; the sacroiliac joints are unremarkable in appearance.  Cervical spinal fusion hardware is noted.  IMPRESSION:  1.  Unremarkable bowel gas pattern; no free intra-abdominal air seen. 2.  No acute cardiopulmonary process identified.  Original Report Authenticated By: Tonia Ghent, M.D.     MDM     Abdominal  Pain is chronic , worse with eating . DDx PUD, gastritis, nonspecific.  Plan : rx hydorcodone-apap prilosec OTC (pt has run out of both) D/c Diclofenac GI referral Dr. Russella Dar  Dx Chronic abdominal pain #2 microscopic hematuria    Doug Sou, MD 08/26/11 0454  Doug Sou, MD 08/26/11 2356

## 2011-08-26 NOTE — ED Notes (Signed)
Pt also reports having diarrhea x2 months.

## 2011-08-27 ENCOUNTER — Telehealth: Payer: Self-pay

## 2011-08-27 MED ORDER — HYDROCODONE-ACETAMINOPHEN 5-325 MG PO TABS: 2.0000 | ORAL_TABLET | ORAL | Status: AC | PRN

## 2011-08-27 NOTE — Telephone Encounter (Signed)
I have left a message for the patient to call back to set up a post ER visit for RLQ abdominal pain

## 2011-09-03 NOTE — Telephone Encounter (Signed)
I have left another message for the patient to please call back to schedule a post ER visit if it is still needed

## 2012-03-02 ENCOUNTER — Emergency Department (HOSPITAL_COMMUNITY): Payer: Medicaid Other

## 2012-03-02 ENCOUNTER — Emergency Department (HOSPITAL_COMMUNITY)
Admission: EM | Admit: 2012-03-02 | Discharge: 2012-03-03 | Disposition: A | Discharge status: Home / Self Care | Payer: Medicaid Other | Attending: Emergency Medicine | Admitting: Emergency Medicine

## 2012-03-02 ENCOUNTER — Encounter (HOSPITAL_COMMUNITY): Payer: Self-pay

## 2012-03-02 DIAGNOSIS — R109 Unspecified abdominal pain: Secondary | ICD-10-CM

## 2012-03-02 DIAGNOSIS — K921 Melena: Secondary | ICD-10-CM | POA: Insufficient documentation

## 2012-03-02 DIAGNOSIS — I10 Essential (primary) hypertension: Secondary | ICD-10-CM | POA: Insufficient documentation

## 2012-03-02 DIAGNOSIS — Z9089 Acquired absence of other organs: Secondary | ICD-10-CM | POA: Insufficient documentation

## 2012-03-02 DIAGNOSIS — E119 Type 2 diabetes mellitus without complications: Secondary | ICD-10-CM | POA: Insufficient documentation

## 2012-03-02 DIAGNOSIS — F17200 Nicotine dependence, unspecified, uncomplicated: Secondary | ICD-10-CM | POA: Insufficient documentation

## 2012-03-02 DIAGNOSIS — IMO0002 Reserved for concepts with insufficient information to code with codable children: Secondary | ICD-10-CM | POA: Insufficient documentation

## 2012-03-02 DIAGNOSIS — R1031 Right lower quadrant pain: Secondary | ICD-10-CM | POA: Insufficient documentation

## 2012-03-02 DIAGNOSIS — Z7982 Long term (current) use of aspirin: Secondary | ICD-10-CM | POA: Insufficient documentation

## 2012-03-02 DIAGNOSIS — Z87442 Personal history of urinary calculi: Secondary | ICD-10-CM | POA: Insufficient documentation

## 2012-03-02 MED ORDER — ONDANSETRON HCL 4 MG/2ML IJ SOLN
4.0000 mg | Freq: Once | INTRAMUSCULAR | Status: AC
Start: 2012-03-02 — End: 2012-03-02
  Administered 2012-03-02: 4 mg via INTRAVENOUS
  Filled 2012-03-02: qty 2

## 2012-03-02 MED ORDER — DIPHENHYDRAMINE HCL 50 MG/ML IJ SOLN
25.0000 mg | Freq: Once | INTRAMUSCULAR | Status: AC
  Administered 2012-03-02: 25 mg via INTRAVENOUS
  Filled 2012-03-02: qty 1

## 2012-03-02 MED ORDER — FENTANYL CITRATE 0.05 MG/ML IJ SOLN
50.0000 ug | Freq: Once | INTRAMUSCULAR | Status: AC
  Administered 2012-03-02: 50 ug via INTRAVENOUS
  Filled 2012-03-02: qty 2

## 2012-03-02 MED ORDER — HYDROMORPHONE HCL PF 1 MG/ML IJ SOLN
1.0000 mg | Freq: Once | INTRAMUSCULAR | Status: AC
  Administered 2012-03-03: 1 mg via INTRAVENOUS
  Filled 2012-03-02: qty 1

## 2012-03-02 MED ORDER — SODIUM CHLORIDE 0.9 % IV SOLN
Freq: Once | INTRAVENOUS | Status: AC
  Administered 2012-03-02: 23:00:00 via INTRAVENOUS

## 2012-03-02 MED ORDER — SODIUM CHLORIDE 0.9 % IV BOLUS (SEPSIS)
1000.0000 mL | Freq: Once | INTRAVENOUS | Status: AC
  Administered 2012-03-02: 1000 mL via INTRAVENOUS

## 2012-03-02 MED ORDER — SODIUM CHLORIDE 0.9 % IV SOLN
Freq: Once | INTRAVENOUS | Status: AC
  Administered 2012-03-03: via INTRAVENOUS

## 2012-03-02 NOTE — ED Notes (Signed)
Abdominal pain, gas, diarrhea x  3 weeks stating the pain is low down and goes around to low back. States he's "noticed the toilet water has been red the last few times"  Pt was seen by his PMD 3 wks ago and was "given a pink pill b/c she thought it was a bug"   Pt states he has pain after he eats to RUQ/RLQ that travels back down to low belly.  Hx cholecystectomy.  Denies fever.  Does get nauseated at times and has vomited x 1 earlier today.

## 2012-03-02 NOTE — ED Provider Notes (Signed)
History     CSN: [id number]  Arrival date & time 03/02/12  2238   First MD Initiated Contact with Patient 03/02/12 2325      Chief Complaint  Patient presents with  . Abdominal Pain    (Consider location/radiation/quality/duration/timing/severity/associated sxs/prior treatment) Patient is a 46 y.o. male presenting with abdominal pain. The history is provided by the patient.  Abdominal Pain The primary symptoms of the illness include abdominal pain.   patient here with abdominal pain x3 weeks is worse after each. Pain is localized to his right lower quadrant and tonight became associated with some blood mixed in the stool. Some nausea without vomiting. No fever noted. Hasn't seen his doctor for this and was placed on medications without relief. Does have a history of cholecystectomy and he is scheduled to see a gastroenterologist in the near future. Symptoms only occur after eating food and he denies urinary symptoms  Past Medical History  Diagnosis Date  . Kidney calculi   . Diabetes mellitus   . Hypertension   . Degenerative disc disease     Past Surgical History  Procedure Date  . Back surgery   . Appendectomy   . Cholecystectomy   . Balloon angioplasty, artery     History reviewed. No pertinent family history.  History  Substance Use Topics  . Smoking status: Current Everyday Smoker -- 1.0 packs/day  . Smokeless tobacco: Not on file  . Alcohol Use: No      Review of Systems  Gastrointestinal: Positive for abdominal pain.  All other systems reviewed and are negative.    Allergies  Bee venom; Morphine and related; and Flexeril  Home Medications   Current Outpatient Rx  Name Route Sig Dispense Refill  . ASPIRIN 81 MG PO TABS Oral Take 81 mg by mouth every morning.      Marland Kitchen DILTIAZEM HCL ER COATED BEADS 120 MG PO CP24 Oral Take 120 mg by mouth daily.      Marland Kitchen GABAPENTIN 300 MG PO CAPS Oral Take 600 mg by mouth 4 (four) times daily - after meals and at bedtime.  The range depends on the amount of pain.    Marland Kitchen GLIMEPIRIDE 2 MG PO TABS Oral Take 2 mg by mouth 2 (two) times daily.     Marland Kitchen HYDROCODONE-ACETAMINOPHEN 10-500 MG PO TABS Oral Take 1 tablet by mouth every 6 (six) hours as needed. pain    . OMEPRAZOLE 20 MG PO CPDR Oral Take 20 mg by mouth 2 (two) times daily.        BP 123/73  Pulse 108  Temp 98.7 F (37.1 C) (Oral)  Resp 18  SpO2 100%  Physical Exam  Nursing note and vitals reviewed. Constitutional: He is oriented to person, place, and time. He appears well-developed and well-nourished.  Non-toxic appearance. No distress.  HENT:  Head: Normocephalic and atraumatic.  Eyes: Conjunctivae, EOM and lids are normal. Pupils are equal, round, and reactive to light.  Neck: Normal range of motion. Neck supple. No tracheal deviation present. No mass present.  Cardiovascular: Regular rhythm and normal heart sounds.  Tachycardia present.  Exam reveals no gallop.   No murmur heard. Pulmonary/Chest: Effort normal and breath sounds normal. No stridor. No respiratory distress. He has no decreased breath sounds. He has no wheezes. He has no rhonchi. He has no rales.  Abdominal: Soft. Normal appearance and bowel sounds are normal. He exhibits no distension. There is tenderness in the right lower quadrant. There is no rigidity, no  rebound, no guarding and no CVA tenderness.  Musculoskeletal: Normal range of motion. He exhibits no edema and no tenderness.  Neurological: He is alert and oriented to person, place, and time. He has normal strength. No cranial nerve deficit or sensory deficit. GCS eye subscore is 4. GCS verbal subscore is 5. GCS motor subscore is 6.  Skin: Skin is warm and dry. No abrasion and no rash noted.  Psychiatric: He has a normal mood and affect. His speech is normal and behavior is normal.    ED Course  Procedures (including critical care time)   Labs Reviewed  COMPREHENSIVE METABOLIC PANEL  CBC WITH DIFFERENTIAL  URINALYSIS,  ROUTINE W REFLEX MICROSCOPIC  LIPASE, BLOOD   No results found.   No diagnosis found.    MDM  Patient given pain medication here and also better at this time. No acute surgical process identified via a CT scan. Repeat abdominal exam is benign. He is stable for discharge        Toy Baker, MD 03/03/12 [phone]

## 2012-03-03 LAB — URINALYSIS, ROUTINE W REFLEX MICROSCOPIC
Bilirubin Urine: NEGATIVE
Glucose, UA: NEGATIVE mg/dL
Hgb urine dipstick: NEGATIVE
Ketones, ur: NEGATIVE mg/dL
Leukocytes, UA: NEGATIVE
Nitrite: NEGATIVE
Protein, ur: NEGATIVE mg/dL
Specific Gravity, Urine: 1.023 (ref 1.005–1.030)
Urobilinogen, UA: 0.2 mg/dL (ref 0.0–1.0)
pH: 6.5 (ref 5.0–8.0)

## 2012-03-03 LAB — LIPASE, BLOOD: Lipase: 33 U/L (ref 11–59)

## 2012-03-03 LAB — COMPREHENSIVE METABOLIC PANEL
ALT: 30 U/L (ref 0–53)
AST: 18 U/L (ref 0–37)
Albumin: 3.8 g/dL (ref 3.5–5.2)
Alkaline Phosphatase: 66 U/L (ref 39–117)
BUN: 15 mg/dL (ref 6–23)
CO2: 20 mEq/L (ref 19–32)
Calcium: 9.5 mg/dL (ref 8.4–10.5)
Chloride: 102 mEq/L (ref 96–112)
Creatinine, Ser: 1 mg/dL (ref 0.50–1.35)
GFR calc Af Amer: >90 mL/min (ref >90)
GFR calc non Af Amer: >90 mL/min (ref >90)
Glucose, Bld: 101 mg/dL — ABNORMAL HIGH (ref 70–99)
Potassium: 3.8 mEq/L (ref 3.5–5.1)
Sodium: 134 mEq/L — ABNORMAL LOW (ref 135–145)
Total Bilirubin: 0.3 mg/dL (ref 0.3–1.2)
Total Protein: 7.6 g/dL (ref 6.0–8.3)

## 2012-03-03 LAB — CBC WITH DIFFERENTIAL/PLATELET
Basophils Absolute: 0.1 10*3/uL (ref 0.0–0.1)
Basophils Relative: 1 % (ref 0–1)
Eosinophils Absolute: 0.2 10*3/uL (ref 0.0–0.7)
Eosinophils Relative: 2 % (ref 0–5)
HCT: 46.6 % (ref 39.0–52.0)
Hemoglobin: 16.3 g/dL (ref 13.0–17.0)
Lymphocytes Relative: 28 % (ref 12–46)
Lymphs Abs: 2.9 10*3/uL (ref 0.7–4.0)
MCH: 30.9 pg (ref 26.0–34.0)
MCHC: 35 g/dL (ref 30.0–36.0)
MCV: 88.4 fL (ref 78.0–100.0)
Monocytes Absolute: 1 10*3/uL (ref 0.1–1.0)
Monocytes Relative: 9 % (ref 3–12)
Neutro Abs: 6.4 10*3/uL (ref 1.7–7.7)
Neutrophils Relative %: 60 % (ref 43–77)
Platelets: 268 10*3/uL (ref 150–400)
RBC: 5.27 MIL/uL (ref 4.22–5.81)
RDW: 13.7 % (ref 11.5–15.5)
WBC: 10.6 10*3/uL — ABNORMAL HIGH (ref 4.0–10.5)

## 2012-03-03 MED ORDER — IOHEXOL 300 MG/ML  SOLN
100.0000 mL | Freq: Once | INTRAMUSCULAR | Status: AC | PRN
  Administered 2012-03-03: 100 mL via INTRAVENOUS

## 2012-03-03 MED ORDER — HYDROMORPHONE HCL PF 1 MG/ML IJ SOLN
1.0000 mg | Freq: Once | INTRAMUSCULAR | Status: AC
  Administered 2012-03-03: 1 mg via INTRAVENOUS
  Filled 2012-03-03: qty 1

## 2012-08-01 ENCOUNTER — Emergency Department (HOSPITAL_COMMUNITY)
Admission: EM | Admit: 2012-08-01 | Discharge: 2012-08-02 | Disposition: A | Discharge status: Home / Self Care | Payer: Self-pay | Attending: Emergency Medicine | Admitting: Emergency Medicine

## 2012-08-01 ENCOUNTER — Encounter (HOSPITAL_COMMUNITY): Payer: Self-pay | Admitting: *Deleted

## 2012-08-01 DIAGNOSIS — R109 Unspecified abdominal pain: Secondary | ICD-10-CM

## 2012-08-01 DIAGNOSIS — E119 Type 2 diabetes mellitus without complications: Secondary | ICD-10-CM | POA: Insufficient documentation

## 2012-08-01 DIAGNOSIS — Y9389 Activity, other specified: Secondary | ICD-10-CM | POA: Insufficient documentation

## 2012-08-01 DIAGNOSIS — I10 Essential (primary) hypertension: Secondary | ICD-10-CM | POA: Insufficient documentation

## 2012-08-01 DIAGNOSIS — S161XXA Strain of muscle, fascia and tendon at neck level, initial encounter: Secondary | ICD-10-CM

## 2012-08-01 DIAGNOSIS — IMO0002 Reserved for concepts with insufficient information to code with codable children: Secondary | ICD-10-CM | POA: Insufficient documentation

## 2012-08-01 DIAGNOSIS — Z79899 Other long term (current) drug therapy: Secondary | ICD-10-CM | POA: Insufficient documentation

## 2012-08-01 DIAGNOSIS — Z87442 Personal history of urinary calculi: Secondary | ICD-10-CM | POA: Insufficient documentation

## 2012-08-01 DIAGNOSIS — S139XXA Sprain of joints and ligaments of unspecified parts of neck, initial encounter: Secondary | ICD-10-CM | POA: Insufficient documentation

## 2012-08-01 DIAGNOSIS — F17200 Nicotine dependence, unspecified, uncomplicated: Secondary | ICD-10-CM | POA: Insufficient documentation

## 2012-08-01 DIAGNOSIS — S3981XA Other specified injuries of abdomen, initial encounter: Secondary | ICD-10-CM | POA: Insufficient documentation
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
## 2012-08-02 ENCOUNTER — Emergency Department (HOSPITAL_COMMUNITY): Payer: Self-pay

## 2012-08-02 ENCOUNTER — Encounter (HOSPITAL_COMMUNITY): Payer: Self-pay | Admitting: Emergency Medicine

## 2012-08-02 LAB — POCT I-STAT, CHEM 8
Creatinine, Ser: 1.2 mg/dL (ref 0.50–1.35)
HCT: 52 % (ref 39.0–52.0)
Hemoglobin: 17.7 g/dL — ABNORMAL HIGH (ref 13.0–17.0)
Potassium: 4.8 mEq/L (ref 3.5–5.1)
Sodium: 140 mEq/L (ref 135–145)
TCO2: 25 mmol/L (ref 0–100)

## 2012-08-02 LAB — CBC WITH DIFFERENTIAL/PLATELET
Hemoglobin: 16.8 g/dL (ref 13.0–17.0)
Lymphocytes Relative: 16 % (ref 12–46)
Lymphs Abs: 1.8 10*3/uL (ref 0.7–4.0)
Monocytes Relative: 9 % (ref 3–12)
Neutro Abs: 8.5 10*3/uL — ABNORMAL HIGH (ref 1.7–7.7)
Neutrophils Relative %: 73 % (ref 43–77)
RBC: 5.43 MIL/uL (ref 4.22–5.81)

## 2012-08-02 LAB — OCCULT BLOOD, POC DEVICE: Fecal Occult Bld: NEGATIVE

## 2012-08-02 MED ORDER — OXYCODONE-ACETAMINOPHEN 5-325 MG PO TABS: 1.0000 | ORAL_TABLET | ORAL | Status: DC | PRN

## 2012-08-02 MED ORDER — DIPHENHYDRAMINE HCL 50 MG/ML IJ SOLN
25.0000 mg | Freq: Once | INTRAMUSCULAR | Status: AC
  Administered 2012-08-02: 25 mg via INTRAVENOUS
  Filled 2012-08-02: qty 1

## 2012-08-02 MED ORDER — NAPROXEN 500 MG PO TABS: 500.0000 mg | ORAL_TABLET | Freq: Two times a day (BID) | ORAL | Status: DC

## 2012-08-02 MED ORDER — HYDROMORPHONE HCL PF 1 MG/ML IJ SOLN
1.0000 mg | Freq: Once | INTRAMUSCULAR | Status: AC
  Administered 2012-08-02: 1 mg via INTRAVENOUS
  Filled 2012-08-02: qty 1

## 2012-08-02 MED ORDER — SODIUM CHLORIDE 0.9 % IV BOLUS (SEPSIS)
1000.0000 mL | Freq: Once | INTRAVENOUS | Status: AC
  Administered 2012-08-02: 1000 mL via INTRAVENOUS

## 2012-08-02 MED ORDER — ONDANSETRON HCL 4 MG/2ML IJ SOLN
4.0000 mg | Freq: Once | INTRAMUSCULAR | Status: AC
  Administered 2012-08-02: 4 mg via INTRAVENOUS
  Filled 2012-08-02: qty 2

## 2012-08-02 MED ORDER — IOHEXOL 300 MG/ML  SOLN
100.0000 mL | Freq: Once | INTRAMUSCULAR | Status: AC | PRN
  Administered 2012-08-02: 100 mL via INTRAVENOUS
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
## 2012-08-02 NOTE — ED Notes (Addendum)
Patient back from CT. Reconnected diagnostic equipment and took vital signs.

## 2012-08-14 ENCOUNTER — Encounter (HOSPITAL_COMMUNITY): Payer: Self-pay | Admitting: *Deleted

## 2012-08-14 ENCOUNTER — Emergency Department (HOSPITAL_COMMUNITY)
Admission: EM | Admit: 2012-08-14 | Discharge: 2012-08-14 | Disposition: A | Discharge status: Home / Self Care | Payer: Self-pay | Attending: Emergency Medicine | Admitting: Emergency Medicine

## 2012-08-14 DIAGNOSIS — Z87828 Personal history of other (healed) physical injury and trauma: Secondary | ICD-10-CM | POA: Insufficient documentation

## 2012-08-14 DIAGNOSIS — E119 Type 2 diabetes mellitus without complications: Secondary | ICD-10-CM | POA: Insufficient documentation

## 2012-08-14 DIAGNOSIS — R109 Unspecified abdominal pain: Secondary | ICD-10-CM | POA: Insufficient documentation

## 2012-08-14 DIAGNOSIS — IMO0002 Reserved for concepts with insufficient information to code with codable children: Secondary | ICD-10-CM | POA: Insufficient documentation

## 2012-08-14 DIAGNOSIS — Z791 Long term (current) use of non-steroidal anti-inflammatories (NSAID): Secondary | ICD-10-CM | POA: Insufficient documentation

## 2012-08-14 DIAGNOSIS — R1033 Periumbilical pain: Secondary | ICD-10-CM | POA: Insufficient documentation

## 2012-08-14 DIAGNOSIS — F17200 Nicotine dependence, unspecified, uncomplicated: Secondary | ICD-10-CM | POA: Insufficient documentation

## 2012-08-14 DIAGNOSIS — K625 Hemorrhage of anus and rectum: Secondary | ICD-10-CM | POA: Insufficient documentation

## 2012-08-14 DIAGNOSIS — Z87442 Personal history of urinary calculi: Secondary | ICD-10-CM | POA: Insufficient documentation

## 2012-08-14 DIAGNOSIS — Z79899 Other long term (current) drug therapy: Secondary | ICD-10-CM | POA: Insufficient documentation

## 2012-08-14 DIAGNOSIS — I10 Essential (primary) hypertension: Secondary | ICD-10-CM | POA: Insufficient documentation

## 2012-08-14 DIAGNOSIS — Z9889 Other specified postprocedural states: Secondary | ICD-10-CM | POA: Insufficient documentation

## 2012-08-14 LAB — CBC WITH DIFFERENTIAL/PLATELET
Hemoglobin: 15 g/dL (ref 13.0–17.0)
Lymphocytes Relative: 29 % (ref 12–46)
Lymphs Abs: 2.5 10*3/uL (ref 0.7–4.0)
MCH: 30.5 pg (ref 26.0–34.0)
Monocytes Relative: 11 % (ref 3–12)
Neutro Abs: 4.5 10*3/uL (ref 1.7–7.7)
Neutrophils Relative %: 53 % (ref 43–77)
Platelets: 247 10*3/uL (ref 150–400)
RBC: 4.91 MIL/uL (ref 4.22–5.81)
WBC: 8.5 10*3/uL (ref 4.0–10.5)

## 2012-08-14 MED ORDER — HYDROMORPHONE HCL PF 1 MG/ML IJ SOLN
1.0000 mg | Freq: Once | INTRAMUSCULAR | Status: AC
  Administered 2012-08-14: 1 mg via INTRAMUSCULAR
  Filled 2012-08-14: qty 1

## 2012-08-14 MED ORDER — FAMOTIDINE 20 MG PO TABS: 20.0000 mg | ORAL_TABLET | Freq: Two times a day (BID) | ORAL | Status: DC

## 2012-08-14 MED ORDER — OXYCODONE-ACETAMINOPHEN 5-325 MG PO TABS: 1.0000 | ORAL_TABLET | ORAL | Status: DC | PRN

## 2012-08-14 MED ORDER — OXYCODONE-ACETAMINOPHEN 5-325 MG PO TABS
2.0000 | ORAL_TABLET | Freq: Once | ORAL | Status: AC
  Administered 2012-08-14: 2 via ORAL
  Filled 2012-08-14: qty 2
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
## 2012-08-16 LAB — OCCULT BLOOD, POC DEVICE: Fecal Occult Bld: POSITIVE — AB

## 2013-06-25 ENCOUNTER — Emergency Department (HOSPITAL_COMMUNITY)
Admission: EM | Admit: 2013-06-25 | Discharge: 2013-06-25 | Disposition: A | Discharge status: Home / Self Care | Payer: Self-pay | Attending: Emergency Medicine | Admitting: Emergency Medicine

## 2013-06-25 ENCOUNTER — Encounter (HOSPITAL_COMMUNITY): Payer: Self-pay | Admitting: Emergency Medicine

## 2013-06-25 ENCOUNTER — Emergency Department (HOSPITAL_COMMUNITY): Payer: Medicaid Other

## 2013-06-25 DIAGNOSIS — M545 Low back pain, unspecified: Secondary | ICD-10-CM | POA: Insufficient documentation

## 2013-06-25 DIAGNOSIS — Z87442 Personal history of urinary calculi: Secondary | ICD-10-CM | POA: Insufficient documentation

## 2013-06-25 DIAGNOSIS — Z888 Allergy status to other drugs, medicaments and biological substances status: Secondary | ICD-10-CM | POA: Insufficient documentation

## 2013-06-25 DIAGNOSIS — I10 Essential (primary) hypertension: Secondary | ICD-10-CM | POA: Insufficient documentation

## 2013-06-25 DIAGNOSIS — R319 Hematuria, unspecified: Secondary | ICD-10-CM | POA: Insufficient documentation

## 2013-06-25 DIAGNOSIS — F17200 Nicotine dependence, unspecified, uncomplicated: Secondary | ICD-10-CM | POA: Insufficient documentation

## 2013-06-25 DIAGNOSIS — Z885 Allergy status to narcotic agent status: Secondary | ICD-10-CM | POA: Insufficient documentation

## 2013-06-25 DIAGNOSIS — E119 Type 2 diabetes mellitus without complications: Secondary | ICD-10-CM | POA: Insufficient documentation

## 2013-06-25 DIAGNOSIS — R110 Nausea: Secondary | ICD-10-CM | POA: Insufficient documentation

## 2013-06-25 DIAGNOSIS — R109 Unspecified abdominal pain: Secondary | ICD-10-CM | POA: Insufficient documentation

## 2013-06-25 DIAGNOSIS — IMO0002 Reserved for concepts with insufficient information to code with codable children: Secondary | ICD-10-CM | POA: Insufficient documentation

## 2013-06-25 DIAGNOSIS — Z79899 Other long term (current) drug therapy: Secondary | ICD-10-CM | POA: Insufficient documentation

## 2013-06-25 LAB — CBC WITH DIFFERENTIAL/PLATELET
Basophils Absolute: 0.1 10*3/uL (ref 0.0–0.1)
Eosinophils Relative: 5 % (ref 0–5)
HCT: 46.9 % (ref 39.0–52.0)
Lymphocytes Relative: 22 % (ref 12–46)
Lymphs Abs: 2 10*3/uL (ref 0.7–4.0)
MCV: 89.5 fL (ref 78.0–100.0)
Monocytes Absolute: 0.8 10*3/uL (ref 0.1–1.0)
Neutro Abs: 5.8 10*3/uL (ref 1.7–7.7)
RBC: 5.24 MIL/uL (ref 4.22–5.81)
RDW: 13.6 % (ref 11.5–15.5)
WBC: 9.2 10*3/uL (ref 4.0–10.5)

## 2013-06-25 LAB — COMPREHENSIVE METABOLIC PANEL
ALT: 19 U/L (ref 0–53)
AST: 19 U/L (ref 0–37)
Albumin: 4.3 g/dL (ref 3.5–5.2)
Alkaline Phosphatase: 73 U/L (ref 39–117)
BUN: 13 mg/dL (ref 6–23)
CO2: 22 mEq/L (ref 19–32)
Calcium: 9.6 mg/dL (ref 8.4–10.5)
Chloride: 103 mEq/L (ref 96–112)
Creatinine, Ser: 1.18 mg/dL (ref 0.50–1.35)
GFR calc Af Amer: 85 mL/min — ABNORMAL LOW (ref >90)
GFR calc non Af Amer: 74 mL/min — ABNORMAL LOW (ref >90)
Glucose, Bld: 102 mg/dL — ABNORMAL HIGH (ref 70–99)
Potassium: 3.8 mEq/L (ref 3.5–5.1)
Sodium: 138 mEq/L (ref 135–145)
Total Bilirubin: 0.3 mg/dL (ref 0.3–1.2)
Total Protein: 7.8 g/dL (ref 6.0–8.3)

## 2013-06-25 LAB — URINALYSIS, ROUTINE W REFLEX MICROSCOPIC
Bilirubin Urine: NEGATIVE
Glucose, UA: NEGATIVE mg/dL
Ketones, ur: 15 mg/dL — AB
Leukocytes, UA: NEGATIVE
Nitrite: NEGATIVE
Protein, ur: NEGATIVE mg/dL
Specific Gravity, Urine: 1.015 (ref 1.005–1.030)
Urobilinogen, UA: 0.2 mg/dL (ref 0.0–1.0)
pH: 8 (ref 5.0–8.0)

## 2013-06-25 LAB — URINE MICROSCOPIC-ADD ON

## 2013-06-25 MED ORDER — ONDANSETRON HCL 4 MG PO TABS: 4.0000 mg | ORAL_TABLET | Freq: Four times a day (QID) | ORAL | Status: DC

## 2013-06-25 MED ORDER — HYDROMORPHONE HCL PF 1 MG/ML IJ SOLN
1.0000 mg | Freq: Once | INTRAMUSCULAR | Status: AC
  Administered 2013-06-25: 1 mg via INTRAVENOUS
  Filled 2013-06-25: qty 1

## 2013-06-25 MED ORDER — KETOROLAC TROMETHAMINE 30 MG/ML IJ SOLN
30.0000 mg | Freq: Once | INTRAMUSCULAR | Status: AC
  Administered 2013-06-25: 30 mg via INTRAVENOUS
  Filled 2013-06-25: qty 1

## 2013-06-25 MED ORDER — SODIUM CHLORIDE 0.9 % IV BOLUS (SEPSIS)
1000.0000 mL | Freq: Once | INTRAVENOUS | Status: AC
  Administered 2013-06-25: 1000 mL via INTRAVENOUS

## 2013-06-25 MED ORDER — OXYCODONE-ACETAMINOPHEN 5-325 MG PO TABS: 2.0000 | ORAL_TABLET | ORAL | Status: DC | PRN

## 2013-06-25 MED ORDER — HYDROCODONE-ACETAMINOPHEN 5-325 MG PO TABS
1.0000 | ORAL_TABLET | Freq: Once | ORAL | Status: AC
  Administered 2013-06-25: 1 via ORAL
  Filled 2013-06-25: qty 1

## 2013-06-25 MED ORDER — HYDROMORPHONE HCL PF 1 MG/ML IJ SOLN: 1.0000 mg | Freq: Once | INTRAMUSCULAR | Status: DC

## 2013-06-25 MED ORDER — ONDANSETRON HCL 4 MG/2ML IJ SOLN
4.0000 mg | Freq: Once | INTRAMUSCULAR | Status: AC
  Administered 2013-06-25: 4 mg via INTRAVENOUS
  Filled 2013-06-25: qty 2

## 2013-06-25 NOTE — ED Notes (Signed)
Pt ambulated to in-room restroom with slow, steady gait.

## 2013-06-25 NOTE — ED Notes (Signed)
Returned from CT.

## 2013-06-25 NOTE — ED Notes (Signed)
Patient discharged with instructions using the teach back method and he verbalizes an understanding.

## 2013-06-25 NOTE — ED Notes (Signed)
Patient transported to CT 

## 2013-06-25 NOTE — ED Notes (Signed)
Rt. Lower abd. Hurts with exertion. Normal bm. Normal urination. Hx. Kidney stones.

## 2013-06-25 NOTE — ED Notes (Signed)
1000 cc of NS had infused.

## 2013-06-25 NOTE — ED Provider Notes (Signed)
CSN: [id number]     Arrival date & time 06/25/13  1191 History   First MD Initiated Contact with Patient 06/25/13 0540     Chief Complaint  Patient presents with  . Flank Pain   (Consider location/radiation/quality/duration/timing/severity/associated sxs/prior Treatment) HPI History provided by patient. Onset of pain last night and right lower back. Today woke up with severe right flank pain radiating to right inguinal region, he has not noticed any hematuria, has nausea no vomiting. History of kidney stones. Pain is sharp in quality. No known alleviating factors. No trauma. No swelling or testicle pain. No dysuria. Symptoms moderate to severe. Past Medical History  Diagnosis Date  . Kidney calculi   . Diabetes mellitus   . Hypertension   . Degenerative disc disease    Past Surgical History  Procedure Laterality Date  . Back surgery    . Appendectomy    . Cholecystectomy    . Balloon angioplasty, artery     History reviewed. No pertinent family history. History  Substance Use Topics  . Smoking status: Current Every Day Smoker -- 1.00 packs/day  . Smokeless tobacco: Not on file  . Alcohol Use: No    Review of Systems  Constitutional: Negative for fever and chills.  Eyes: Negative for visual disturbance.  Respiratory: Negative for shortness of breath.   Cardiovascular: Negative for chest pain.  Gastrointestinal: Positive for nausea and abdominal pain.  Genitourinary: Negative for dysuria.  Musculoskeletal: Negative for back pain, neck pain and neck stiffness.  Skin: Negative for rash.  Neurological: Negative for headaches.  All other systems reviewed and are negative.    Allergies  Bee venom; Morphine and related; and Flexeril  Home Medications   Current Outpatient Rx  Name  Route  Sig  Dispense  Refill  . citalopram (CELEXA) 10 MG tablet   Oral   Take 10 mg by mouth daily.         . famotidine (PEPCID) 20 MG tablet   Oral   Take 1 tablet (20 mg total) by  mouth 2 (two) times daily.   30 tablet   0   . gabapentin (NEURONTIN) 300 MG capsule   Oral   Take 600 mg by mouth 2 (two) times daily. The range depends on the amount of pain.         Marland Kitchen HYDROcodone-acetaminophen (NORCO) 10-325 MG per tablet   Oral   Take 1 tablet by mouth every 6 (six) hours as needed. For pain         . hydrOXYzine (ATARAX/VISTARIL) 25 MG tablet   Oral   Take 25 mg by mouth 3 (three) times daily as needed. For itching         . ibuprofen (ADVIL,MOTRIN) 400 MG tablet   Oral   Take 400 mg by mouth every 6 (six) hours as needed. FOR PAIN         . methocarbamol (ROBAXIN) 750 MG tablet   Oral   Take 750 mg by mouth 4 (four) times daily.         . naproxen (NAPROSYN) 500 MG tablet   Oral   Take 1 tablet (500 mg total) by mouth 2 (two) times daily with a meal.   30 tablet   0   . omeprazole (PRILOSEC) 20 MG capsule   Oral   Take 20 mg by mouth 2 (two) times daily.           Marland Kitchen oxyCODONE-acetaminophen (PERCOCET) 5-325 MG per tablet  Oral   Take 1 tablet by mouth every 4 (four) hours as needed for pain.   20 tablet   0   . SUMAtriptan (IMITREX) 50 MG tablet   Oral   Take 50 mg by mouth every 2 (two) hours as needed.          BP 148/93  Pulse 99  Temp(Src) 97 F (36.1 C) (Oral)  Resp 20  SpO2 98% Physical Exam  Constitutional: He is oriented to person, place, and time. He appears well-developed and well-nourished.  HENT:  Head: Normocephalic and atraumatic.  Eyes: EOM are normal. Pupils are equal, round, and reactive to light. No scleral icterus.  Neck: Neck supple.  Cardiovascular: Normal rate, regular rhythm and intact distal pulses.   Pulmonary/Chest: Effort normal and breath sounds normal. No respiratory distress.  Abdominal: Soft. Bowel sounds are normal. He exhibits no distension.  Mild right flank and right lower quadrant tenderness without palpable mass or hernia. No CVA tenderness. No rebound or guarding or acute abdomen  otherwise  Musculoskeletal: Normal range of motion. He exhibits no edema.  Neurological: He is alert and oriented to person, place, and time.  Skin: Skin is warm and dry.    ED Course  Procedures (including critical care time) Labs Review Labs Reviewed  COMPREHENSIVE METABOLIC PANEL - Abnormal; Notable for the following:    Glucose, Bld 102 (*)    GFR calc non Af Amer 74 (*)    GFR calc Af Amer 85 (*)    All other components within normal limits  URINALYSIS, ROUTINE W REFLEX MICROSCOPIC - Abnormal; Notable for the following:    APPearance CLOUDY (*)    Hgb urine dipstick MODERATE (*)    Ketones, ur 15 (*)    All other components within normal limits  CBC WITH DIFFERENTIAL  LIPASE, BLOOD  URINE MICROSCOPIC-ADD ON   Imaging Review Ct Abdomen Pelvis Wo Contrast  06/25/2013   CLINICAL DATA:  Right flank pain, radiating to the right groin.  EXAM: CT ABDOMEN AND PELVIS WITHOUT CONTRAST  TECHNIQUE: Multidetector CT imaging of the abdomen and pelvis was performed following the standard protocol without intravenous contrast.  COMPARISON:  CT of the abdomen and pelvis performed 05/01/2013  FINDINGS: The visualized lung bases are clear.  The liver and spleen are unremarkable in appearance. The patient is status post cholecystectomy, with clips along the gallbladder fossa. The pancreas and adrenal glands are unremarkable.  Scattered nonobstructing bilateral renal stones are seen. The kidneys are otherwise unremarkable in appearance. There is no evidence of hydronephrosis. No obstructing ureteral stones are identified. No perinephric stranding is appreciated.  No free fluid is identified. The small bowel is unremarkable in appearance. The stomach is within normal limits. No acute vascular abnormalities are seen.  The patient is status post appendectomy. The colon is largely decompressed and is unremarkable in appearance.  The bladder is mildly distended and grossly unremarkable; a small urachal remnant  is incidentally noted. The prostate remains normal in size. No inguinal lymphadenopathy is seen.  No acute osseous abnormalities are identified.  IMPRESSION: 1. No acute abnormality seen to explain the patient's symptoms. 2. Scattered nonobstructing bilateral renal stones seen. No evidence of hydronephrosis.   Electronically Signed   By: Roanna Raider M.D.   On: 06/25/2013 06:50   IV fluids. IV Dilaudid and Zofran provided. IV Toradol  Labs and urinalysis and CT results shared with patient.  7:03 AM on recheck symptoms are much improved. Patient requesting one more shot  of pain medication and then he will fill comfortable being discharged.  To improve symptoms with hematuria, possibly recently passed stone. Urology referral provided. Strict return precautions verbalized is understood. Prescriptions provided  MDM  Diagnosis: Abdominal pain  Evaluated with labs and urinalysis and imaging as above Treated with IV fluids IV narcotics - improving condition Vital signs and nursing notes reviewed and considered    Sunnie Nielsen, MD 06/25/13 0710

## 2015-11-18 ENCOUNTER — Emergency Department (HOSPITAL_COMMUNITY)
Admission: EM | Admit: 2015-11-18 | Discharge: 2015-11-18 | Disposition: A | Discharge status: Home / Self Care | Payer: BLUE CROSS/BLUE SHIELD | Attending: Emergency Medicine | Admitting: Emergency Medicine

## 2015-11-18 ENCOUNTER — Emergency Department (HOSPITAL_COMMUNITY): Payer: BLUE CROSS/BLUE SHIELD

## 2015-11-18 ENCOUNTER — Encounter (HOSPITAL_COMMUNITY): Payer: Self-pay | Admitting: Emergency Medicine

## 2015-11-18 DIAGNOSIS — F17210 Nicotine dependence, cigarettes, uncomplicated: Secondary | ICD-10-CM | POA: Diagnosis not present

## 2015-11-18 DIAGNOSIS — Z79899 Other long term (current) drug therapy: Secondary | ICD-10-CM | POA: Diagnosis not present

## 2015-11-18 DIAGNOSIS — E119 Type 2 diabetes mellitus without complications: Secondary | ICD-10-CM | POA: Insufficient documentation

## 2015-11-18 DIAGNOSIS — R1031 Right lower quadrant pain: Secondary | ICD-10-CM | POA: Diagnosis present

## 2015-11-18 DIAGNOSIS — R319 Hematuria, unspecified: Secondary | ICD-10-CM | POA: Diagnosis not present

## 2015-11-18 DIAGNOSIS — Z87442 Personal history of urinary calculi: Secondary | ICD-10-CM | POA: Diagnosis not present

## 2015-11-18 DIAGNOSIS — R300 Dysuria: Secondary | ICD-10-CM | POA: Insufficient documentation

## 2015-11-18 DIAGNOSIS — I10 Essential (primary) hypertension: Secondary | ICD-10-CM | POA: Insufficient documentation

## 2015-11-18 DIAGNOSIS — Z8739 Personal history of other diseases of the musculoskeletal system and connective tissue: Secondary | ICD-10-CM | POA: Insufficient documentation

## 2015-11-18 LAB — COMPREHENSIVE METABOLIC PANEL
ALBUMIN: 4.5 g/dL (ref 3.5–5.0)
ALK PHOS: 61 U/L (ref 38–126)
ALT: 20 U/L (ref 17–63)
ANION GAP: 12 (ref 5–15)
AST: 24 U/L (ref 15–41)
BUN: 16 mg/dL (ref 6–20)
CALCIUM: 9.5 mg/dL (ref 8.9–10.3)
CHLORIDE: 108 mmol/L (ref 101–111)
CO2: 24 mmol/L (ref 22–32)
Creatinine, Ser: 1.33 mg/dL — ABNORMAL HIGH (ref 0.61–1.24)
GFR calc Af Amer: >60 mL/min (ref >60)
GFR calc non Af Amer: >60 mL/min (ref >60)
GLUCOSE: 142 mg/dL — AB (ref 65–99)
POTASSIUM: 3.8 mmol/L (ref 3.5–5.1)
Sodium: 144 mmol/L (ref 135–145)
Total Bilirubin: 0.6 mg/dL (ref 0.3–1.2)
Total Protein: 7.6 g/dL (ref 6.5–8.1)

## 2015-11-18 LAB — URINE MICROSCOPIC-ADD ON

## 2015-11-18 LAB — CBC
HEMATOCRIT: 45.4 % (ref 39.0–52.0)
HEMOGLOBIN: 16 g/dL (ref 13.0–17.0)
MCH: 31.1 pg (ref 26.0–34.0)
MCHC: 35.2 g/dL (ref 30.0–36.0)
MCV: 88.3 fL (ref 78.0–100.0)
Platelets: 263 10*3/uL (ref 150–400)
RBC: 5.14 MIL/uL (ref 4.22–5.81)
RDW: 13.3 % (ref 11.5–15.5)
WBC: 10.3 10*3/uL (ref 4.0–10.5)

## 2015-11-18 LAB — URINALYSIS, ROUTINE W REFLEX MICROSCOPIC
BILIRUBIN URINE: NEGATIVE
GLUCOSE, UA: 100 mg/dL — AB
Ketones, ur: NEGATIVE mg/dL
Leukocytes, UA: NEGATIVE
Nitrite: NEGATIVE
PH: 7.5 (ref 5.0–8.0)
Protein, ur: NEGATIVE mg/dL
SPECIFIC GRAVITY, URINE: 1.02 (ref 1.005–1.030)

## 2015-11-18 LAB — LIPASE, BLOOD: LIPASE: 37 U/L (ref 11–51)

## 2015-11-18 MED ORDER — HYDROCODONE-ACETAMINOPHEN 5-325 MG PO TABS: 1.0000 | ORAL_TABLET | Freq: Four times a day (QID) | ORAL | Status: DC | PRN

## 2015-11-18 MED ORDER — HYDROMORPHONE HCL 1 MG/ML IJ SOLN
1.0000 mg | Freq: Once | INTRAMUSCULAR | Status: AC
Start: 2015-11-18 — End: 2015-11-18
  Administered 2015-11-18: 1 mg via INTRAVENOUS
  Filled 2015-11-18: qty 1

## 2015-11-18 MED ORDER — KETOROLAC TROMETHAMINE 30 MG/ML IJ SOLN
30.0000 mg | Freq: Once | INTRAMUSCULAR | Status: AC
  Administered 2015-11-18: 30 mg via INTRAVENOUS
  Filled 2015-11-18: qty 1

## 2015-11-18 MED ORDER — ONDANSETRON HCL 4 MG PO TABS: 4.0000 mg | ORAL_TABLET | Freq: Four times a day (QID) | ORAL | Status: AC

## 2015-11-18 MED ORDER — ONDANSETRON HCL 4 MG/2ML IJ SOLN
4.0000 mg | Freq: Once | INTRAMUSCULAR | Status: AC
  Administered 2015-11-18: 4 mg via INTRAVENOUS
  Filled 2015-11-18: qty 2

## 2015-11-18 MED ORDER — HYDROMORPHONE HCL 1 MG/ML IJ SOLN
1.0000 mg | Freq: Once | INTRAMUSCULAR | Status: AC
  Administered 2015-11-18: 1 mg via INTRAVENOUS
  Filled 2015-11-18: qty 1

## 2015-11-18 MED ORDER — CEPHALEXIN 500 MG PO CAPS
500.0000 mg | ORAL_CAPSULE | Freq: Once | ORAL | Status: AC
  Administered 2015-11-18: 500 mg via ORAL
  Filled 2015-11-18: qty 1

## 2015-11-18 MED ORDER — CEPHALEXIN 500 MG PO CAPS: 500.0000 mg | ORAL_CAPSULE | Freq: Four times a day (QID) | ORAL | Status: AC

## 2015-11-18 MED ORDER — ONDANSETRON HCL 4 MG/2ML IJ SOLN: 4.0000 mg | Freq: Once | INTRAMUSCULAR | Status: DC | PRN

## 2015-11-18 NOTE — ED Provider Notes (Signed)
CSN: [id number]     Arrival date & time 11/18/15  1954 History   First MD Initiated Contact with Patient 11/18/15 2057     Chief Complaint  Patient presents with  . Abdominal Pain    RLQ  . Dysuria     (Consider location/radiation/quality/duration/timing/severity/associated sxs/prior Treatment) Patient is a 50 y.o. male presenting with abdominal pain and dysuria. The history is provided by the patient (Patient complains of right lower quadrant abdominal pain.. Patient has a history of kidney stones feel like this is another).  Abdominal Pain Pain location:  RLQ Pain quality: aching   Pain radiates to:  Does not radiate Pain severity:  Moderate Onset quality:  Sudden Timing:  Constant Progression:  Waxing and waning Chronicity:  Recurrent Context: alcohol use   Associated symptoms: dysuria   Associated symptoms: no chest pain, no cough, no diarrhea, no fatigue and no hematuria   Dysuria Associated symptoms include abdominal pain. Pertinent negatives include no chest pain and no headaches.    Past Medical History  Diagnosis Date  . Kidney calculi   . Diabetes mellitus   . Hypertension   . Degenerative disc disease    Past Surgical History  Procedure Laterality Date  . Back surgery    . Appendectomy    . Cholecystectomy    . Balloon angioplasty, artery    . Knee surgery Bilateral    History reviewed. No pertinent family history. Social History  Substance Use Topics  . Smoking status: Current Every Day Smoker -- 1.00 packs/day    Types: Cigarettes  . Smokeless tobacco: None  . Alcohol Use: No    Review of Systems  Constitutional: Negative for appetite change and fatigue.  HENT: Negative for congestion, ear discharge and sinus pressure.   Eyes: Negative for discharge.  Respiratory: Negative for cough.   Cardiovascular: Negative for chest pain.  Gastrointestinal: Positive for abdominal pain. Negative for diarrhea.  Genitourinary: Positive for dysuria. Negative  for frequency and hematuria.  Musculoskeletal: Negative for back pain.  Skin: Negative for rash.  Neurological: Negative for seizures and headaches.  Psychiatric/Behavioral: Negative for hallucinations.      Allergies  Bee venom; Morphine and related; and Flexeril  Home Medications   Prior to Admission medications   Medication Sig Start Date End Date Taking? Authorizing Provider  gabapentin (NEURONTIN) 300 MG capsule Take 300 mg by mouth 2 (two) times daily. 11/02/15 11/01/16 Yes Historical Provider, MD  omeprazole (PRILOSEC) 20 MG capsule Take 20 mg by mouth 2 (two) times daily.     Yes Historical Provider, MD  cephALEXin (KEFLEX) 500 MG capsule Take 1 capsule (500 mg total) by mouth 4 (four) times daily. 11/18/15   Bethann BerkshireJoseph Wyndham Santilli, MD  HYDROcodone-acetaminophen (NORCO/VICODIN) 5-325 MG tablet Take 1 tablet by mouth every 6 (six) hours as needed for moderate pain. 11/18/15   Bethann BerkshireJoseph Denyce Harr, MD  ondansetron (ZOFRAN) 4 MG tablet Take 1 tablet (4 mg total) by mouth every 6 (six) hours. 11/18/15   Bethann BerkshireJoseph Shiza Thelen, MD   BP 131/61 mmHg  Pulse 88  Temp(Src) 98.8 F (37.1 C) (Oral)  Resp 18  SpO2 96% Physical Exam  Constitutional: He is oriented to person, place, and time. He appears well-developed.  HENT:  Head: Normocephalic.  Eyes: Conjunctivae and EOM are normal. No scleral icterus.  Neck: Neck supple. No thyromegaly present.  Cardiovascular: Normal rate and regular rhythm.  Exam reveals no gallop and no friction rub.   No murmur heard. Pulmonary/Chest: No stridor. He has no  wheezes. He has no rales. He exhibits no tenderness.  Abdominal: He exhibits no distension. There is tenderness. There is no rebound.  Tender right lower quadrant  Genitourinary:  Tender right flank  Musculoskeletal: Normal range of motion. He exhibits no edema.  Lymphadenopathy:    He has no cervical adenopathy.  Neurological: He is oriented to person, place, and time. He exhibits normal muscle tone. Coordination  normal.  Skin: No rash noted. No erythema.  Psychiatric: He has a normal mood and affect. His behavior is normal.    ED Course  Procedures (including critical care time) Labs Review Labs Reviewed  COMPREHENSIVE METABOLIC PANEL - Abnormal; Notable for the following:    Glucose, Bld 142 (*)    Creatinine, Ser 1.33 (*)    All other components within normal limits  URINALYSIS, ROUTINE W REFLEX MICROSCOPIC (NOT AT [facility]) - Abnormal; Notable for the following:    APPearance CLOUDY (*)    Glucose, UA 100 (*)    Hgb urine dipstick LARGE (*)    All other components within normal limits  URINE MICROSCOPIC-ADD ON - Abnormal; Notable for the following:    Squamous Epithelial / LPF 0-5 (*)    Bacteria, UA MANY (*)    Casts HYALINE CASTS (*)    All other components within normal limits  URINE CULTURE  LIPASE, BLOOD  CBC    Imaging Review Ct Renal Stone Study  11/18/2015  CLINICAL DATA:  Abdominal pain with burning and discolored urine. Initial encounter. EXAM: CT ABDOMEN AND PELVIS WITHOUT CONTRAST TECHNIQUE: Multidetector CT imaging of the abdomen and pelvis was performed following the standard protocol without IV contrast. COMPARISON:  08/29/2015 FINDINGS: Lower chest and abdominal wall:  No contributory findings. Hepatobiliary: No focal liver abnormality.Cholecystectomy. Normal common bile duct diameter. Pancreas: Unremarkable. Spleen: Unremarkable. Adrenals/Urinary Tract:  Negative adrenals. No hydronephrosis or ureteral calculus. At least 6 left and 2 right renal calculi. Calculi are small, measuring up to 2 mm. Probable sub cm cyst in the interpolar left kidney on coronal reformats. Unremarkable bladder. Reproductive:Negative. Stomach/Bowel:  No obstruction. Appendectomy. Vascular/Lymphatic: Mild atheromatous calcification. No acute vascular abnormality. No mass or adenopathy. Peritoneal: No ascites or pneumoperitoneum. Musculoskeletal: Lumbar disc and facet degeneration. Disc narrowing is  focally advanced at L4-5 where there is chronic right far-lateral disc herniation with vacuum phenomenon. IMPRESSION: 1. No hydronephrosis or ureteral calculus. 2. Bilateral nephrolithiasis. Electronically Signed   By: Marnee Spring M.D.   On: 11/18/2015 21:29   I have personally reviewed and evaluated these images and lab results as part of my medical decision-making.   EKG Interpretation None      MDM   Final diagnoses:  Hematuria    Patient has blood in his urine along with bacteria. CT scan does not show any stones in his ureter. Patient may have passed a stone. Patient will be put on antibiotics pain medicine and nausea medicine and referred to his urologist   Bethann Berkshire, MD 11/18/15 2232

## 2015-11-18 NOTE — ED Notes (Signed)
Patient c/o abd pain, RLQ. Patient states he believes he passed a kidney stone. Patient reports burning and discoloration to his urine. Initial onset four days ago, worsening last night.

## 2015-11-18 NOTE — Discharge Instructions (Signed)
Follow-up with your urologist this week. °

## 2015-11-20 LAB — URINE CULTURE
Culture: NO GROWTH
SPECIAL REQUESTS: NORMAL

## 2018-03-04 ENCOUNTER — Other Ambulatory Visit: Payer: Self-pay

## 2018-03-04 ENCOUNTER — Emergency Department (HOSPITAL_BASED_OUTPATIENT_CLINIC_OR_DEPARTMENT_OTHER): Payer: BLUE CROSS/BLUE SHIELD

## 2018-03-04 ENCOUNTER — Encounter (HOSPITAL_BASED_OUTPATIENT_CLINIC_OR_DEPARTMENT_OTHER): Payer: Self-pay | Admitting: *Deleted

## 2018-03-04 ENCOUNTER — Emergency Department (HOSPITAL_BASED_OUTPATIENT_CLINIC_OR_DEPARTMENT_OTHER)
Admission: EM | Admit: 2018-03-04 | Discharge: 2018-03-04 | Disposition: A | Discharge status: Home / Self Care | Payer: BLUE CROSS/BLUE SHIELD | Attending: Emergency Medicine | Admitting: Emergency Medicine

## 2018-03-04 DIAGNOSIS — R1031 Right lower quadrant pain: Secondary | ICD-10-CM | POA: Diagnosis not present

## 2018-03-04 DIAGNOSIS — Y939 Activity, unspecified: Secondary | ICD-10-CM | POA: Diagnosis not present

## 2018-03-04 DIAGNOSIS — Y929 Unspecified place or not applicable: Secondary | ICD-10-CM | POA: Diagnosis not present

## 2018-03-04 DIAGNOSIS — S39012A Strain of muscle, fascia and tendon of lower back, initial encounter: Secondary | ICD-10-CM

## 2018-03-04 DIAGNOSIS — I10 Essential (primary) hypertension: Secondary | ICD-10-CM | POA: Diagnosis not present

## 2018-03-04 DIAGNOSIS — X58XXXA Exposure to other specified factors, initial encounter: Secondary | ICD-10-CM | POA: Insufficient documentation

## 2018-03-04 DIAGNOSIS — S299XXA Unspecified injury of thorax, initial encounter: Secondary | ICD-10-CM | POA: Diagnosis present

## 2018-03-04 DIAGNOSIS — F17210 Nicotine dependence, cigarettes, uncomplicated: Secondary | ICD-10-CM | POA: Insufficient documentation

## 2018-03-04 DIAGNOSIS — Z79899 Other long term (current) drug therapy: Secondary | ICD-10-CM | POA: Diagnosis not present

## 2018-03-04 DIAGNOSIS — S29012A Strain of muscle and tendon of back wall of thorax, initial encounter: Secondary | ICD-10-CM | POA: Insufficient documentation

## 2018-03-04 DIAGNOSIS — Z9049 Acquired absence of other specified parts of digestive tract: Secondary | ICD-10-CM | POA: Insufficient documentation

## 2018-03-04 DIAGNOSIS — R109 Unspecified abdominal pain: Secondary | ICD-10-CM

## 2018-03-04 DIAGNOSIS — Y998 Other external cause status: Secondary | ICD-10-CM | POA: Insufficient documentation

## 2018-03-04 DIAGNOSIS — E119 Type 2 diabetes mellitus without complications: Secondary | ICD-10-CM | POA: Insufficient documentation

## 2018-03-04 LAB — COMPREHENSIVE METABOLIC PANEL
ALT: 20 U/L (ref 0–44)
ANION GAP: 8 (ref 5–15)
AST: 15 U/L (ref 15–41)
Albumin: 3.9 g/dL (ref 3.5–5.0)
Alkaline Phosphatase: 54 U/L (ref 38–126)
BUN: 18 mg/dL (ref 6–20)
CHLORIDE: 105 mmol/L (ref 98–111)
CO2: 24 mmol/L (ref 22–32)
CREATININE: 1.14 mg/dL (ref 0.61–1.24)
Calcium: 8.8 mg/dL — ABNORMAL LOW (ref 8.9–10.3)
GFR calc non Af Amer: >60 mL/min (ref >60)
Glucose, Bld: 176 mg/dL — ABNORMAL HIGH (ref 70–99)
POTASSIUM: 4 mmol/L (ref 3.5–5.1)
SODIUM: 137 mmol/L (ref 135–145)
Total Bilirubin: 0.4 mg/dL (ref 0.3–1.2)
Total Protein: 6.7 g/dL (ref 6.5–8.1)

## 2018-03-04 LAB — URINALYSIS, ROUTINE W REFLEX MICROSCOPIC
BILIRUBIN URINE: NEGATIVE
Glucose, UA: 250 mg/dL — AB
HGB URINE DIPSTICK: NEGATIVE
Ketones, ur: NEGATIVE mg/dL
Leukocytes, UA: NEGATIVE
Nitrite: NEGATIVE
PROTEIN: NEGATIVE mg/dL
SPECIFIC GRAVITY, URINE: 1.01 (ref 1.005–1.030)
pH: 6 (ref 5.0–8.0)

## 2018-03-04 LAB — CBC WITH DIFFERENTIAL/PLATELET
Basophils Absolute: 0.1 10*3/uL (ref 0.0–0.1)
Basophils Relative: 1 %
EOS ABS: 0.2 10*3/uL (ref 0.0–0.7)
EOS PCT: 2 %
HCT: 45.4 % (ref 39.0–52.0)
Hemoglobin: 15.8 g/dL (ref 13.0–17.0)
Lymphocytes Relative: 22 %
Lymphs Abs: 2 10*3/uL (ref 0.7–4.0)
MCH: 31.3 pg (ref 26.0–34.0)
MCHC: 34.8 g/dL (ref 30.0–36.0)
MCV: 89.9 fL (ref 78.0–100.0)
Monocytes Absolute: 0.8 10*3/uL (ref 0.1–1.0)
Monocytes Relative: 9 %
Neutro Abs: 6.2 10*3/uL (ref 1.7–7.7)
Neutrophils Relative %: 66 %
PLATELETS: 220 10*3/uL (ref 150–400)
RBC: 5.05 MIL/uL (ref 4.22–5.81)
RDW: 13.7 % (ref 11.5–15.5)
WBC: 9.3 10*3/uL (ref 4.0–10.5)

## 2018-03-04 MED ORDER — ONDANSETRON HCL 4 MG/2ML IJ SOLN
4.0000 mg | Freq: Once | INTRAMUSCULAR | Status: AC
  Administered 2018-03-04: 4 mg via INTRAVENOUS
  Filled 2018-03-04: qty 2

## 2018-03-04 MED ORDER — FENTANYL CITRATE (PF) 100 MCG/2ML IJ SOLN
50.0000 ug | Freq: Once | INTRAMUSCULAR | Status: AC
  Administered 2018-03-04: 50 ug via INTRAVENOUS
  Filled 2018-03-04: qty 2

## 2018-03-04 MED ORDER — ORPHENADRINE CITRATE ER 100 MG PO TB12: 100.0000 mg | ORAL_TABLET | Freq: Two times a day (BID) | ORAL | 0 refills | Status: AC

## 2018-03-04 MED ORDER — IOPAMIDOL (ISOVUE-300) INJECTION 61%
100.0000 mL | Freq: Once | INTRAVENOUS | Status: AC | PRN
  Administered 2018-03-04: 100 mL via INTRAVENOUS

## 2018-03-04 MED ORDER — HYDROCODONE-ACETAMINOPHEN 5-325 MG PO TABS: 1.0000 | ORAL_TABLET | ORAL | 0 refills | Status: DC | PRN

## 2018-03-04 MED ORDER — IOPAMIDOL (ISOVUE-300) INJECTION 61%: 30.0000 mL | Freq: Once | INTRAVENOUS | Status: DC | PRN

## 2018-03-04 MED ORDER — SODIUM CHLORIDE 0.9 % IV BOLUS
1000.0000 mL | Freq: Once | INTRAVENOUS | Status: AC
  Administered 2018-03-04: 1000 mL via INTRAVENOUS

## 2018-03-04 NOTE — ED Triage Notes (Signed)
Right flank pain x 2 days. States he has a hx of kidney stones but this pain does not feel the same.

## 2018-03-04 NOTE — ED Provider Notes (Signed)
Patient care assumed from Dr. Anitra LauthPlunkett at shift change.  And to review CT results for final disposition.  If no acute findings patient suitable for discharge.  Patient is alert and nontoxic.  He indicates his pain is in the right paraspinous mid lumbar area.  He then proceeds radiation that comes around to his groin.  And has significant grimacing expression of pain when he has to transition from supine to sitting and return to supine or to roll in the stretcher.  Localizes his pain into the paraspinous area in his low mid back.  I have palpated the abdomen which is soft without guarding.  No mass or fullness in the groin.   Patient's diagnostic evaluation is negative for acute etiology.  She reports this does not feel like a kidney stone.  CT shows no acute findings and urinalysis negative.  At this time, this appears highly suggestive of musculoskeletal back pain.  I discussed plan with the patient and his wife.  We will continue ibuprofen as he does for his baseline pain control.  Will use Norflex and Vicodin as needed over the next 3 days.  He will follow-up with his PCP for recheck.  Return precautions reviewed.   Arby BarrettePfeiffer, Shterna Laramee, MD 03/04/18 [phone]

## 2018-03-04 NOTE — ED Notes (Signed)
NAD at this time. Pt is stable and going home.  

## 2018-03-04 NOTE — ED Notes (Signed)
Pt returned from CT °

## 2018-03-04 NOTE — ED Notes (Signed)
Patient transported to CT 

## 2018-03-04 NOTE — ED Provider Notes (Signed)
[facility] EMERGENCY DEPARTMENT Provider Note   CSN: [id number] Arrival date & time: 03/04/18  1149     History   Chief Complaint Chief Complaint  Patient presents with  . Flank Pain    HPI Mark Flowers is a 52 y.o. male.  The history is provided by the patient.  Flank Pain  This is a new problem. The current episode started 2 days ago. The problem occurs constantly. The problem has been gradually worsening. Associated symptoms include abdominal pain. Associated symptoms comments: Pain in the RLQ which radiates to the right flank and down to the right groin.  Radiating pain started today.  Over the last 2 days it has been waxing and waning.  Worse today.  No Nause or vomiting.  Pain is affected by movements and bumps in the car but not affected by eating.  Minimal burning with urination this morning.. The symptoms are aggravated by bending, twisting and walking. The symptoms are relieved by rest. He has tried nothing for the symptoms. The treatment provided no relief.    Past Medical History:  Diagnosis Date  . Degenerative disc disease   . Diabetes mellitus   . Hypertension   . Kidney calculi     There are no active problems to display for this patient.   Past Surgical History:  Procedure Laterality Date  . APPENDECTOMY    . BACK SURGERY    . BALLOON ANGIOPLASTY, ARTERY    . CHOLECYSTECTOMY    . KNEE SURGERY Bilateral         Home Medications    Prior to Admission medications   Medication Sig Start Date End Date Taking? Authorizing Provider  omeprazole (PRILOSEC) 20 MG capsule Take 20 mg by mouth 2 (two) times daily.     Yes [provider]  cephALEXin (KEFLEX) 500 MG capsule Take 1 capsule (500 mg total) by mouth 4 (four) times daily. 11/18/15   Bethann Berkshire, MD  HYDROcodone-acetaminophen (NORCO/VICODIN) 5-325 MG tablet Take 1 tablet by mouth every 6 (six) hours as needed for moderate pain. 11/18/15   Bethann Berkshire, MD  ondansetron  (ZOFRAN) 4 MG tablet Take 1 tablet (4 mg total) by mouth every 6 (six) hours. 11/18/15   Bethann Berkshire, MD    Family History No family history on file.  Social History Social History   Tobacco Use  . Smoking status: Current Every Day Smoker    Packs/day: 1.00    Types: Cigarettes  . Smokeless tobacco: Never Used  Substance Use Topics  . Alcohol use: No  . Drug use: No     Allergies   Bee venom; Morphine and related; and Flexeril [cyclobenzaprine hcl]   Review of Systems Review of Systems  Gastrointestinal: Positive for abdominal pain.  Genitourinary: Positive for flank pain.  All other systems reviewed and are negative.    Physical Exam Updated Vital Signs BP 115/81   Pulse 84   Temp 98.4 F (36.9 C) (Oral)   Resp 20   Ht 6' (1.829 m)   Wt 126.6 kg (279 lb)   SpO2 100%   BMI 37.84 kg/m   Physical Exam  Constitutional: He is oriented to person, place, and time. He appears well-developed and well-nourished. No distress.  HENT:  Head: Normocephalic and atraumatic.  Mouth/Throat: Oropharynx is clear and moist.  Eyes: Pupils are equal, round, and reactive to light. Conjunctivae and EOM are normal.  Neck: Normal range of motion. Neck supple.  Cardiovascular: Normal rate, regular rhythm  and intact distal pulses.  No murmur heard. Pulmonary/Chest: Effort normal and breath sounds normal. No respiratory distress. He has no wheezes. He has no rales.  Abdominal: Soft. He exhibits no distension. There is tenderness in the right lower quadrant. There is guarding. There is no rebound and no CVA tenderness. Hernia confirmed negative in the right inguinal area.  Genitourinary: Testes normal and penis normal. Right testis shows no tenderness. Circumcised.  Musculoskeletal: Normal range of motion. He exhibits no edema or tenderness.  Lymphadenopathy: No inguinal adenopathy noted on the right side.  Neurological: He is alert and oriented to person, place, and time.  Skin: Skin  is warm and dry. No rash noted. No erythema.  Psychiatric: He has a normal mood and affect. His behavior is normal.  Nursing note and vitals reviewed.    ED Treatments / Results  Labs (all labs ordered are listed, but only abnormal results are displayed) Labs Reviewed  URINALYSIS, ROUTINE W REFLEX MICROSCOPIC - Abnormal; Notable for the following components:      Result Value   Glucose, UA 250 (*)    All other components within normal limits    EKG None  Radiology No results found.  Procedures Procedures (including critical care time)  Medications Ordered in ED Medications - No data to display   Initial Impression / Assessment and Plan / ED Course  I have reviewed the triage vital signs and the nursing notes.  Pertinent labs & imaging results that were available during my care of the patient were reviewed by me and considered in my medical decision making (see chart for details).    Patient is a 52 year old male with a history of kidney stones, diabetes and chronic neck back pain who is presenting today with pain in his right lower quadrant for the last 2 and half days.  He states the pain was initially off and on but today it has been constant radiating up into his flank and down into his groin.  He has no infectious symptoms such as fever, cough, runny nose.  He has no nausea or vomiting.  Pain is not worse with eating but worse with movements.  He has no evidence of hernia on exam.  Patient's urine is within normal limits other than glucose.  CBC, CMP without acute findings.  We will do a CT to further evaluate to ensure this is not diverticulitis or renal stone.  Patient has already had a cholecystectomy and appendectomy.  He was given IV fluids, pain and nausea medication.    Final Clinical Impressions(s) / ED Diagnoses   Final diagnoses:  None    ED Discharge Orders    None       Gwyneth SproutPlunkett, Caylan Chenard, MD 03/05/18 [id number]

## 2018-04-13 ENCOUNTER — Ambulatory Visit (HOSPITAL_COMMUNITY)
Admission: EM | Admit: 2018-04-13 | Discharge: 2018-04-13 | Disposition: A | Discharge status: Home / Self Care | Payer: BLUE CROSS/BLUE SHIELD | Attending: Family Medicine | Admitting: Family Medicine

## 2018-04-13 ENCOUNTER — Encounter (HOSPITAL_COMMUNITY): Payer: Self-pay

## 2018-04-13 DIAGNOSIS — R319 Hematuria, unspecified: Secondary | ICD-10-CM

## 2018-04-13 DIAGNOSIS — R109 Unspecified abdominal pain: Secondary | ICD-10-CM | POA: Diagnosis not present

## 2018-04-13 LAB — POCT URINALYSIS DIP (DEVICE)
BILIRUBIN URINE: NEGATIVE
Glucose, UA: >=1000 mg/dL — AB
KETONES UR: NEGATIVE mg/dL
LEUKOCYTES UA: NEGATIVE
Nitrite: NEGATIVE
Protein, ur: NEGATIVE mg/dL
SPECIFIC GRAVITY, URINE: 1.015 (ref 1.005–1.030)
UROBILINOGEN UA: 0.2 mg/dL (ref 0.0–1.0)
pH: 6.5 (ref 5.0–8.0)

## 2018-04-13 MED ORDER — KETOROLAC TROMETHAMINE 60 MG/2ML IM SOLN
INTRAMUSCULAR | Status: AC
  Filled 2018-04-13: qty 2

## 2018-04-13 MED ORDER — HYDROCODONE-ACETAMINOPHEN 5-325 MG PO TABS: 1.0000 | ORAL_TABLET | Freq: Four times a day (QID) | ORAL | 0 refills | Status: AC | PRN

## 2018-04-13 MED ORDER — KETOROLAC TROMETHAMINE 60 MG/2ML IM SOLN
60.0000 mg | Freq: Once | INTRAMUSCULAR | Status: AC
  Administered 2018-04-13: 60 mg via INTRAMUSCULAR

## 2018-04-13 NOTE — ED Triage Notes (Signed)
Pt presents with left back and flan pain; passing kidney stones

## 2018-04-13 NOTE — ED Provider Notes (Signed)
[facility]   [id number] 04/13/18 Arrival Time: 1629  ASSESSMENT & PLAN:  1. Left flank pain   2. Hematuria, unspecified type    Discussed suspicion of kidney stone.  Meds ordered this encounter  Medications  . ketorolac (TORADOL) injection 60 mg  . HYDROcodone-acetaminophen (NORCO/VICODIN) 5-325 MG tablet    Sig: Take 1 tablet by mouth every 6 (six) hours as needed for moderate pain or severe pain.    Dispense:  8 tablet    Refill:  0   Some improvement after Toradol this evening.   Discharge Instructions     Be aware, pain medications may cause drowsiness. Please do not drive, operate heavy machinery or make important decisions while on this medication, it can cloud your judgement.    He is familiar with kidney stones and has passed most of them before. Agrees to proceed to the ED if any of his symptoms worsen. Plans f/u with PCP.  Reviewed expectations re: course of current medical issues. Questions answered. Outlined signs and symptoms indicating need for more acute intervention. Patient verbalized understanding. After Visit Summary given.   SUBJECTIVE:  Mark Flowers is a 52 y.o. male who presents with complaint of intermittent L flank discomfort. Onset abrupt, a few hours ago without radiation. Described as sharp. Symptoms are on and off since beginning. Aggravating factors: none. Alleviating factors: none. Associated symptoms: mild nausea. He denies fever and vomiting. Appetite: normal. PO intake: normal. Ambulatory without assistance. Urinary symptoms: none. Last bowel movement yesterday without blood. Reports passing a small kidney stone a couple of days ago. No gross hematuria reported OTC treatment: ibuprofen without much help.  Past Surgical History:  Procedure Laterality Date  . APPENDECTOMY    . BACK SURGERY    . BALLOON ANGIOPLASTY, ARTERY    . CHOLECYSTECTOMY    . KNEE SURGERY Bilateral     ROS: As per HPI. All other systems  negative.  OBJECTIVE:  Vitals:   04/13/18 1651  BP: 135/72  Pulse: 75  Resp: 20  Temp: 98.2 F (36.8 C)  TempSrc: Oral  SpO2: 100%    General appearance: alert; no distress but appears uncomfortable Lungs: clear to auscultation bilaterally Heart: regular rate and rhythm Abdomen: soft; non-distended; no tenderness; bowel sounds present; no masses or organomegaly; no guarding or rebound tenderness Back: mild L CVA tenderness; FROM at hips Extremities: no edema; symmetrical with no gross deformities Skin: warm and dry Neurologic: normal gait Psychological: alert and cooperative; normal mood and affect  Labs: Results for orders placed or performed during the hospital encounter of 04/13/18  POCT urinalysis dip (device)  Result Value Ref Range   Glucose, UA >=1000 (A) NEGATIVE mg/dL   Bilirubin Urine NEGATIVE NEGATIVE   Ketones, ur NEGATIVE NEGATIVE mg/dL   Specific Gravity, Urine 1.015 1.005 - 1.030   Hgb urine dipstick MODERATE (A) NEGATIVE   pH 6.5 5.0 - 8.0   Protein, ur NEGATIVE NEGATIVE mg/dL   Urobilinogen, UA 0.2 0.0 - 1.0 mg/dL   Nitrite NEGATIVE NEGATIVE   Leukocytes, UA NEGATIVE NEGATIVE   Labs Reviewed  POCT URINALYSIS DIP (DEVICE) - Abnormal; Notable for the following components:      Result Value   Glucose, UA >=1000 (*)    Hgb urine dipstick MODERATE (*)    All other components within normal limits    Allergies  Allergen Reactions  . Bee Venom Anaphylaxis, Swelling and Rash  . Morphine And Related Itching and Swelling  . Flexeril [Cyclobenzaprine Hcl]  Palpitations                                               Past Medical History:  Diagnosis Date  . Degenerative disc disease   . Diabetes mellitus   . Hypertension   . Kidney calculi    Social History   Socioeconomic History  . Marital status: Married    Spouse name: Not on file  . Number of children: Not on file  . Years of education: Not on file  . Highest education level: Not on file   Occupational History  . Not on file  Social Needs  . Financial resource strain: Not on file  . Food insecurity:    Worry: Not on file    Inability: Not on file  . Transportation needs:    Medical: Not on file    Non-medical: Not on file  Tobacco Use  . Smoking status: Current Every Day Smoker    Packs/day: 1.00    Types: Cigarettes  . Smokeless tobacco: Never Used  Substance and Sexual Activity  . Alcohol use: No  . Drug use: No  . Sexual activity: Not on file  Lifestyle  . Physical activity:    Days per week: Not on file    Minutes per session: Not on file  . Stress: Not on file  Relationships  . Social connections:    Talks on phone: Not on file    Gets together: Not on file    Attends religious service: Not on file    Active member of club or organization: Not on file    Attends meetings of clubs or organizations: Not on file    Relationship status: Not on file  . Intimate partner violence:    Fear of current or ex partner: Not on file    Emotionally abused: Not on file    Physically abused: Not on file    Forced sexual activity: Not on file  Other Topics Concern  . Not on file  Social History Narrative  . Not on file   FH: nephrolithiasis   Mardella LaymanHagler, Jariyah Hackley, MD 04/14/18 1049

## 2018-04-13 NOTE — Discharge Instructions (Addendum)
Be aware, pain medications may cause drowsiness. Please do not drive, operate heavy machinery or make important decisions while on this medication, it can cloud your judgement.  

## 2018-07-25 ENCOUNTER — Other Ambulatory Visit: Payer: Self-pay

## 2018-07-25 ENCOUNTER — Emergency Department (HOSPITAL_COMMUNITY): Payer: Self-pay

## 2018-07-25 ENCOUNTER — Emergency Department (HOSPITAL_COMMUNITY)
Admission: EM | Admit: 2018-07-25 | Discharge: 2018-07-25 | Disposition: A | Discharge status: Home / Self Care | Payer: Self-pay | Attending: Emergency Medicine | Admitting: Emergency Medicine

## 2018-07-25 ENCOUNTER — Encounter (HOSPITAL_COMMUNITY): Payer: Self-pay | Admitting: *Deleted

## 2018-07-25 DIAGNOSIS — E119 Type 2 diabetes mellitus without complications: Secondary | ICD-10-CM | POA: Insufficient documentation

## 2018-07-25 DIAGNOSIS — F17210 Nicotine dependence, cigarettes, uncomplicated: Secondary | ICD-10-CM | POA: Insufficient documentation

## 2018-07-25 DIAGNOSIS — M5442 Lumbago with sciatica, left side: Secondary | ICD-10-CM | POA: Insufficient documentation

## 2018-07-25 DIAGNOSIS — R1032 Left lower quadrant pain: Secondary | ICD-10-CM | POA: Insufficient documentation

## 2018-07-25 DIAGNOSIS — R1031 Right lower quadrant pain: Secondary | ICD-10-CM | POA: Insufficient documentation

## 2018-07-25 DIAGNOSIS — Z79899 Other long term (current) drug therapy: Secondary | ICD-10-CM | POA: Insufficient documentation

## 2018-07-25 DIAGNOSIS — R109 Unspecified abdominal pain: Secondary | ICD-10-CM

## 2018-07-25 DIAGNOSIS — I10 Essential (primary) hypertension: Secondary | ICD-10-CM | POA: Insufficient documentation

## 2018-07-25 LAB — URINALYSIS, ROUTINE W REFLEX MICROSCOPIC
Bacteria, UA: NONE SEEN
Bilirubin Urine: NEGATIVE
Glucose, UA: >=500 mg/dL — AB
Ketones, ur: 5 mg/dL — AB
Leukocytes, UA: NEGATIVE
Nitrite: NEGATIVE
Protein, ur: NEGATIVE mg/dL
Specific Gravity, Urine: 1.015 (ref 1.005–1.030)
pH: 7 (ref 5.0–8.0)

## 2018-07-25 LAB — CBC WITH DIFFERENTIAL/PLATELET
Abs Immature Granulocytes: 0.03 10*3/uL (ref 0.00–0.07)
Basophils Absolute: 0.1 10*3/uL (ref 0.0–0.1)
Basophils Relative: 1 %
Eosinophils Absolute: 0.2 10*3/uL (ref 0.0–0.5)
Eosinophils Relative: 2 %
HCT: 50.7 % (ref 39.0–52.0)
Hemoglobin: 16.4 g/dL (ref 13.0–17.0)
Immature Granulocytes: 0 %
Lymphocytes Relative: 23 %
Lymphs Abs: 2.2 10*3/uL (ref 0.7–4.0)
MCH: 29.2 pg (ref 26.0–34.0)
MCHC: 32.3 g/dL (ref 30.0–36.0)
MCV: 90.2 fL (ref 80.0–100.0)
Monocytes Absolute: 1.1 10*3/uL — ABNORMAL HIGH (ref 0.1–1.0)
Monocytes Relative: 12 %
Neutro Abs: 6 10*3/uL (ref 1.7–7.7)
Neutrophils Relative %: 62 %
Platelets: 321 10*3/uL (ref 150–400)
RBC: 5.62 MIL/uL (ref 4.22–5.81)
RDW: 13.7 % (ref 11.5–15.5)
WBC: 9.6 10*3/uL (ref 4.0–10.5)
nRBC: 0 % (ref 0.0–0.2)

## 2018-07-25 LAB — BASIC METABOLIC PANEL
Anion gap: 9 (ref 5–15)
BUN: 15 mg/dL (ref 6–20)
CO2: 24 mmol/L (ref 22–32)
Calcium: 9.7 mg/dL (ref 8.9–10.3)
Chloride: 106 mmol/L (ref 98–111)
Creatinine, Ser: 1.28 mg/dL — ABNORMAL HIGH (ref 0.61–1.24)
GFR calc Af Amer: >60 mL/min (ref >60)
GFR calc non Af Amer: >60 mL/min (ref >60)
Glucose, Bld: 133 mg/dL — ABNORMAL HIGH (ref 70–99)
Potassium: 4 mmol/L (ref 3.5–5.1)
Sodium: 139 mmol/L (ref 135–145)

## 2018-07-25 MED ORDER — KETOROLAC TROMETHAMINE 15 MG/ML IJ SOLN
15.0000 mg | Freq: Once | INTRAMUSCULAR | Status: AC
  Administered 2018-07-25: 15 mg via INTRAMUSCULAR
  Filled 2018-07-25: qty 1

## 2018-07-25 MED ORDER — OXYCODONE-ACETAMINOPHEN 5-325 MG PO TABS: 1.0000 | ORAL_TABLET | ORAL | 0 refills | Status: DC | PRN

## 2018-07-25 MED ORDER — PROMETHAZINE HCL 25 MG/ML IJ SOLN
12.5000 mg | Freq: Once | INTRAMUSCULAR | Status: AC
  Administered 2018-07-25: 12.5 mg via INTRAMUSCULAR
  Filled 2018-07-25: qty 1

## 2018-07-25 MED ORDER — HYDROMORPHONE HCL 1 MG/ML IJ SOLN
1.0000 mg | Freq: Once | INTRAMUSCULAR | Status: AC
  Administered 2018-07-25: 1 mg via INTRAMUSCULAR
  Filled 2018-07-25: qty 1

## 2018-07-25 MED ORDER — DEXAMETHASONE 4 MG PO TABS
8.0000 mg | ORAL_TABLET | Freq: Once | ORAL | Status: AC
  Administered 2018-07-25: 8 mg via ORAL
  Filled 2018-07-25: qty 2

## 2018-07-25 NOTE — ED Notes (Signed)
Pt stable, ambulatory, states understanding of discharge instructions 

## 2018-07-25 NOTE — ED Notes (Signed)
Patient transported to CT 

## 2018-07-25 NOTE — ED Triage Notes (Signed)
PT reports lower back pain radiates into bil Flank pain. Pt reports he  has kidney stones  bil . Now.

## 2018-08-05 NOTE — ED Provider Notes (Signed)
[name] Provider Note   CSN: [id number] Arrival date & time: 07/25/18  1619     History   Chief Complaint Chief Complaint  Patient presents with  . Back Pain    HPI Araceli BoucheJames L Bergum is a 52 y.o. male.  HPI   52 year old male with lower back pain.  Gradual onset about 2 days ago.  Persistent and progressive since then.  Radiates into both flanks.  He has a past history of kidney stones and lower back pain.  No specific urinary complaints.  No fevers or chills.  No acute numbness, loss of strength.  Past Medical History:  Diagnosis Date  . Degenerative disc disease   . Diabetes mellitus   . Hypertension   . Kidney calculi     There are no active problems to display for this patient.   Past Surgical History:  Procedure Laterality Date  . APPENDECTOMY    . BACK SURGERY    . BALLOON ANGIOPLASTY, ARTERY    . CHOLECYSTECTOMY    . KNEE SURGERY Bilateral         Home Medications    Prior to Admission medications   Medication Sig Start Date End Date Taking? Authorizing Provider  cephALEXin (KEFLEX) 500 MG capsule Take 1 capsule (500 mg total) by mouth 4 (four) times daily. 11/18/15   Bethann BerkshireZammit, Joseph, MD  HYDROcodone-acetaminophen (NORCO/VICODIN) 5-325 MG tablet Take 1 tablet by mouth every 6 (six) hours as needed for moderate pain or severe pain. 04/13/18   Mardella LaymanHagler, Brian, MD  omeprazole (PRILOSEC) 20 MG capsule Take 20 mg by mouth 2 (two) times daily.      [provider]  ondansetron (ZOFRAN) 4 MG tablet Take 1 tablet (4 mg total) by mouth every 6 (six) hours. 11/18/15   Bethann BerkshireZammit, Joseph, MD  orphenadrine (NORFLEX) 100 MG tablet Take 1 tablet (100 mg total) by mouth 2 (two) times daily. 03/04/18   Arby BarrettePfeiffer, Marcy, MD  oxyCODONE-acetaminophen (PERCOCET/ROXICET) 5-325 MG tablet Take 1-2 tablets by mouth every 4 (four) hours as needed for severe pain. 07/25/18   Raeford RazorKohut, Demtrius Rounds, MD    Family History History reviewed. No  pertinent family history.  Social History Social History   Tobacco Use  . Smoking status: Current Every Day Smoker    Packs/day: 1.00    Types: Cigarettes  . Smokeless tobacco: Never Used  Substance Use Topics  . Alcohol use: No  . Drug use: No     Allergies   Bee venom; Morphine and related; and Flexeril [cyclobenzaprine hcl]   Review of Systems Review of Systems  All systems reviewed and negative, other than as noted in HPI.  Physical Exam Updated Vital Signs BP (!) 143/86 (BP Location: Right Arm)   Pulse 82   Temp 97.9 F (36.6 C) (Oral)   Resp 18   Ht 6' (1.829 m)   Wt 122.9 kg   SpO2 98%   BMI 36.75 kg/m   Physical Exam  Constitutional: He appears well-developed and well-nourished. No distress.  HENT:  Head: Normocephalic and atraumatic.  Eyes: Conjunctivae are normal. Right eye exhibits no discharge. Left eye exhibits no discharge.  Neck: Neck supple.  Cardiovascular: Normal rate, regular rhythm and normal heart sounds. Exam reveals no gallop and no friction rub.  No murmur heard. Pulmonary/Chest: Effort normal and breath sounds normal. No respiratory distress.  Abdominal: Soft. He exhibits no distension. There is no tenderness.  Musculoskeletal: He exhibits no edema.  Tenderness to palpation diffusely  spine both paraspinally and midline.  Tenderness in the flanks.  No concerning skin changes.  Neurological: He is alert.  Strength is 5 out of 5 days.  Sensation intact light touch.  Increased pain in the lower back with flexion of either hip.  Neurovascular intact distally  Skin: Skin is warm and dry.  Psychiatric: He has a normal mood and affect. His behavior is normal. Thought content normal.  Nursing note and vitals reviewed.    ED Treatments / Results  Labs (all labs ordered are listed, but only abnormal results are displayed) Labs Reviewed  URINALYSIS, ROUTINE W REFLEX MICROSCOPIC - Abnormal; Notable for the following components:      Result  Value   Glucose, UA >=500 (*)    Hgb urine dipstick SMALL (*)    Ketones, ur 5 (*)    All other components within normal limits  CBC WITH DIFFERENTIAL/PLATELET - Abnormal; Notable for the following components:   Monocytes Absolute 1.1 (*)    All other components within normal limits  BASIC METABOLIC PANEL - Abnormal; Notable for the following components:   Glucose, Bld 133 (*)    Creatinine, Ser 1.28 (*)    All other components within normal limits    EKG None  Radiology No results found.   Ct Renal Stone Study  Result Date: 07/25/2018 CLINICAL DATA:  Bilateral flank and low back pain beginning yesterday. Nephrolithiasis. EXAM: CT ABDOMEN AND PELVIS WITHOUT CONTRAST TECHNIQUE: Multidetector CT imaging of the abdomen and pelvis was performed following the standard protocol without IV contrast. COMPARISON:  03/04/2018 FINDINGS: Lower chest: No acute findings. Hepatobiliary: No mass visualized on this unenhanced exam. Prior cholecystectomy. No evidence of biliary obstruction. Pancreas: No mass or inflammatory process visualized on this unenhanced exam. Spleen:  Within normal limits in size. Adrenals/Urinary tract: Multiple small less than 1 cm calculi are again seen in both kidneys. No evidence of ureteral calculi or hydronephrosis. Unremarkable unopacified urinary bladder. Stomach/Bowel: No evidence of obstruction, inflammatory process, or abnormal fluid collections. Vascular/Lymphatic: No pathologically enlarged lymph nodes identified. No evidence of abdominal aortic aneurysm. Mild Aortic atherosclerosis. Reproductive:  No mass or other significant abnormality. Other:  None. Musculoskeletal: No suspicious bone lesions identified. PLIF hardware again seen at L4-5. IMPRESSION: Bilateral nephrolithiasis. No evidence of ureteral calculi, hydronephrosis, or other acute findings. Electronically Signed   By: Myles Rosenthal M.D.   On: 07/25/2018 19:18    Procedures Procedures (including critical care  time)  Medications Ordered in ED Medications  HYDROmorphone (DILAUDID) injection 1 mg (1 mg Intramuscular Given 07/25/18 1807)  ketorolac (TORADOL) 15 MG/ML injection 15 mg (15 mg Intramuscular Given 07/25/18 1805)  promethazine (PHENERGAN) injection 12.5 mg (12.5 mg Intramuscular Given 07/25/18 1809)  dexamethasone (DECADRON) tablet 8 mg (8 mg Oral Given 07/25/18 1950)     Initial Impression / Assessment and Plan / ED Course  I have reviewed the triage vital signs and the nursing notes.  Pertinent labs & imaging results that were available during my care of the patient were reviewed by me and considered in my medical decision making (see chart for details).     52 year old male with lower back and bilateral flank pain.  Suspect muscular skeletal etiology.  He does have symptoms and explains but UA looks okay CT is without acute abnormality.  He is neurologically intact.  Good pulses in feet.  Plan symptomatic treatment.  Return precautions were discussed.  Outpatient follow-up otherwise.  Final Clinical Impressions(s) / ED Diagnoses   Final  diagnoses:  Flank pain  Left-sided low back pain with left-sided sciatica, unspecified chronicity    ED Discharge Orders         Ordered    oxyCODONE-acetaminophen (PERCOCET/ROXICET) 5-325 MG tablet  Every 4 hours PRN     07/25/18 1945           Raeford Razor, MD 08/05/18 1247

## 2020-04-29 ENCOUNTER — Other Ambulatory Visit: Payer: Self-pay

## 2020-04-29 ENCOUNTER — Emergency Department (HOSPITAL_COMMUNITY): Payer: BLUE CROSS/BLUE SHIELD

## 2020-04-29 ENCOUNTER — Emergency Department (HOSPITAL_COMMUNITY)
Admission: EM | Admit: 2020-04-29 | Discharge: 2020-04-30 | Disposition: A | Discharge status: Home / Self Care | Payer: BLUE CROSS/BLUE SHIELD | Attending: Emergency Medicine | Admitting: Emergency Medicine

## 2020-04-29 ENCOUNTER — Encounter (HOSPITAL_COMMUNITY): Payer: Self-pay | Admitting: Emergency Medicine

## 2020-04-29 DIAGNOSIS — F17210 Nicotine dependence, cigarettes, uncomplicated: Secondary | ICD-10-CM | POA: Diagnosis not present

## 2020-04-29 DIAGNOSIS — I10 Essential (primary) hypertension: Secondary | ICD-10-CM | POA: Diagnosis not present

## 2020-04-29 DIAGNOSIS — M5432 Sciatica, left side: Secondary | ICD-10-CM | POA: Diagnosis not present

## 2020-04-29 DIAGNOSIS — Z79899 Other long term (current) drug therapy: Secondary | ICD-10-CM | POA: Diagnosis not present

## 2020-04-29 DIAGNOSIS — E119 Type 2 diabetes mellitus without complications: Secondary | ICD-10-CM | POA: Insufficient documentation

## 2020-04-29 DIAGNOSIS — M5441 Lumbago with sciatica, right side: Secondary | ICD-10-CM

## 2020-04-29 DIAGNOSIS — M5442 Lumbago with sciatica, left side: Secondary | ICD-10-CM

## 2020-04-29 DIAGNOSIS — M5431 Sciatica, right side: Secondary | ICD-10-CM | POA: Insufficient documentation

## 2020-04-29 DIAGNOSIS — M545 Low back pain: Secondary | ICD-10-CM | POA: Diagnosis present

## 2020-04-29 HISTORY — DX: Calculus of kidney: N20.0

## 2020-04-29 MED ORDER — OXYCODONE-ACETAMINOPHEN 5-325 MG PO TABS
1.0000 | ORAL_TABLET | Freq: Once | ORAL | Status: AC
  Administered 2020-04-29: 1 via ORAL
  Filled 2020-04-29: qty 1

## 2020-04-29 NOTE — ED Triage Notes (Signed)
Pt st's he fell approx 8-9 ft off a ladder 2-3 days ago  Pt c/o severe lower back pain radiating down both legs but worse on left.

## 2020-04-30 ENCOUNTER — Other Ambulatory Visit: Payer: Self-pay

## 2020-04-30 MED ORDER — HYDROMORPHONE HCL 1 MG/ML IJ SOLN
0.5000 mg | Freq: Once | INTRAMUSCULAR | Status: AC
  Administered 2020-04-30: 0.5 mg via INTRAMUSCULAR
  Filled 2020-04-30: qty 1

## 2020-04-30 MED ORDER — ACETAMINOPHEN 500 MG PO TABS
1000.0000 mg | ORAL_TABLET | Freq: Once | ORAL | Status: AC
  Administered 2020-04-30: 1000 mg via ORAL
  Filled 2020-04-30: qty 2

## 2020-04-30 MED ORDER — KETOROLAC TROMETHAMINE 60 MG/2ML IM SOLN
15.0000 mg | Freq: Once | INTRAMUSCULAR | Status: AC
  Administered 2020-04-30: 15 mg via INTRAMUSCULAR
  Filled 2020-04-30: qty 2

## 2020-04-30 MED ORDER — DIAZEPAM 5 MG PO TABS
5.0000 mg | ORAL_TABLET | Freq: Once | ORAL | Status: AC
  Administered 2020-04-30: 5 mg via ORAL
  Filled 2020-04-30: qty 1

## 2020-04-30 NOTE — Discharge Instructions (Signed)
Take your medicines as prescribed by your pain management physician.  Please call them today and let them know that you have had an exacerbation of your back pain.  See when they want to see you in the office or if they want to change your medications.  There is been a lot of research on back pain, unfortunately the only thing that seems to really help is Tylenol and ibuprofen.  Relative rest is also important to not lift greater than 10 pounds bending or twisting at the waist.  Please follow-up with your family physician.  The other thing that really seems to benefit patients is physical therapy which your doctor may send you for.  Please return to the emergency department for new numbness or weakness to your arms or legs. Difficulty with urinating or urinating or pooping on yourself.  Also if you cannot feel toilet paper when you wipe or get a fever.

## 2020-04-30 NOTE — ED Provider Notes (Signed)
[name]   CSN: [id number] Arrival date & time: 04/29/20  1906     History Chief Complaint  Patient presents with  . Back Pain    Mark Flowers is a 54 y.o. male.  54 yo M with a chief complaints of low back pain that radiates to bilateral knees.  Been going on for a few days after he fell from a ladder.  The patient was cleaning out his gutters and thinks he fell approximately 10 feet.  Denies loss of bowel or bladder denies loss of peritoneal sensation.  He unfortunately has chronic low back pain that has a similar sensation.  He is currently in the care of pain management and takes Lyrica for this.  Denies other injury in the fall.  Also passing a kidney stone on the right side he thinks.  The history is provided by the patient.  Back Pain Location:  Lumbar spine Quality:  Aching and burning Radiates to:  L knee and R knee Pain severity:  Moderate Onset quality:  Gradual Duration:  2 days Timing:  Constant Progression:  Worsening Chronicity:  New Context: falling   Relieved by:  Nothing Worsened by:  Ambulation, bending, movement and palpation Ineffective treatments:  None tried Associated symptoms: no abdominal pain, no chest pain, no fever and no headaches        Past Medical History:  Diagnosis Date  . Degenerative disc disease   . Diabetes mellitus   . Hypertension   . Kidney calculi   . Kidney stones     There are no problems to display for this patient.   Past Surgical History:  Procedure Laterality Date  . APPENDECTOMY    . BACK SURGERY    . BALLOON ANGIOPLASTY, ARTERY    . CHOLECYSTECTOMY    . KNEE SURGERY Bilateral        No family history on file.  Social History   Tobacco Use  . Smoking status: Current Every Day Smoker    Packs/day: 1.00    Types: Cigarettes  . Smokeless tobacco: Never Used  Vaping Use  . Vaping Use: Never used  Substance Use Topics  . Alcohol use: No  .  Drug use: No    Home Medications Prior to Admission medications   Medication Sig Start Date End Date Taking? Authorizing Provider  cephALEXin (KEFLEX) 500 MG capsule Take 1 capsule (500 mg total) by mouth 4 (four) times daily. 11/18/15   Bethann Berkshire, MD  HYDROcodone-acetaminophen (NORCO/VICODIN) 5-325 MG tablet Take 1 tablet by mouth every 6 (six) hours as needed for moderate pain or severe pain. 04/13/18   Mardella Layman, MD  omeprazole (PRILOSEC) 20 MG capsule Take 20 mg by mouth 2 (two) times daily.      [provider]  ondansetron (ZOFRAN) 4 MG tablet Take 1 tablet (4 mg total) by mouth every 6 (six) hours. 11/18/15   Bethann Berkshire, MD  orphenadrine (NORFLEX) 100 MG tablet Take 1 tablet (100 mg total) by mouth 2 (two) times daily. 03/04/18   Arby Barrette, MD  oxyCODONE-acetaminophen (PERCOCET/ROXICET) 5-325 MG tablet Take 1-2 tablets by mouth every 4 (four) hours as needed for severe pain. 07/25/18   Raeford Razor, MD    Allergies    Bee venom, Morphine and related, and Flexeril [cyclobenzaprine hcl]  Review of Systems   Review of Systems  Constitutional: Negative for chills and fever.  HENT: Negative for congestion and facial swelling.   Eyes:  Negative for discharge and visual disturbance.  Respiratory: Negative for shortness of breath.   Cardiovascular: Negative for chest pain and palpitations.  Gastrointestinal: Negative for abdominal pain, diarrhea and vomiting.  Musculoskeletal: Positive for back pain. Negative for arthralgias and myalgias.  Skin: Negative for color change and rash.  Neurological: Negative for tremors, syncope and headaches.  Psychiatric/Behavioral: Negative for confusion and dysphoric mood.    Physical Exam Updated Vital Signs BP (!) 138/102 (BP Location: Left Arm)   Pulse 89   Temp 98.5 F (36.9 C) (Oral)   Resp 18   Ht 6' (1.829 m)   Wt 120.7 kg   SpO2 99%   BMI 36.08 kg/m   Physical Exam Vitals and nursing note reviewed.    Constitutional:      Appearance: He is well-developed.  HENT:     Head: Normocephalic and atraumatic.  Eyes:     Pupils: Pupils are equal, round, and reactive to light.  Neck:     Vascular: No JVD.  Cardiovascular:     Rate and Rhythm: Normal rate and regular rhythm.     Heart sounds: No murmur heard.  No friction rub. No gallop.   Pulmonary:     Effort: No respiratory distress.     Breath sounds: No wheezing.  Abdominal:     General: There is no distension.     Tenderness: There is no abdominal tenderness. There is no guarding or rebound.  Musculoskeletal:        General: Normal range of motion.     Cervical back: Normal range of motion and neck supple.     Comments: Mild tenderness about the midline L-spine about L1-3.  No step-offs or deformities.  Pulse motor and sensation are intact distally.  Reflexes are 2+ and equal.  There is no clonus.  Skin:    Coloration: Skin is not pale.     Findings: No rash.  Neurological:     Mental Status: He is alert and oriented to person, place, and time.  Psychiatric:        Behavior: Behavior normal.     ED Results / Procedures / Treatments   Labs (all labs ordered are listed, but only abnormal results are displayed) Labs Reviewed - No data to display  EKG None  Radiology DG Lumbar Spine Complete  Result Date: 04/29/2020 CLINICAL DATA:  Back pain from a fall off ladder EXAM: LUMBAR SPINE - COMPLETE 4+ VIEW COMPARISON:  Lumbar spine radiographs 03/03/2020 FINDINGS: There is posterior fusion hardware from L3 through L5 which appears intact. Unchanged alignment. Vertebral body heights are maintained. There is intervertebral disc space loss at L3-4 and L4-5 similar to prior with associated anterior spurring. SI joints are open and symmetric. Nonobstructive bowel gas pattern. IMPRESSION: No acute osseous abnormality identified radiographically in the lumbar spine. Electronically Signed   By: Emmaline Kluver M.D.   On: 04/29/2020 20:46     Procedures Procedures (including critical care time)  Medications Ordered in ED Medications  acetaminophen (TYLENOL) tablet 1,000 mg (has no administration in time range)  ketorolac (TORADOL) injection 15 mg (has no administration in time range)  HYDROmorphone (DILAUDID) injection 0.5 mg (has no administration in time range)  diazepam (VALIUM) tablet 5 mg (has no administration in time range)  oxyCODONE-acetaminophen (PERCOCET/ROXICET) 5-325 MG per tablet 1 tablet (1 tablet Oral Given 04/29/20 2009)    ED Course  I have reviewed the triage vital signs and the nursing notes.  Pertinent labs & imaging results  that were available during my care of the patient were reviewed by me and considered in my medical decision making (see chart for details).    MDM Rules/Calculators/A&P                          54 yo M with a chief complaints of low back pain after fall.  Plain film viewed by me without fracture.  The patient unfortunately has chronic low back pain and is seen by pain management for this.  On his drug database review he has had 23 different providers prescribe him level 1 medications.  I do not feel comfortable prescribing him any other narcotics or muscle relaxants.  We will treat his symptoms here.  Have him follow-up with his pain management physician.  8:06 AM:  I have discussed the diagnosis/risks/treatment options with the patient and believe the pt to be eligible for discharge home to follow-up with Pain managment. We also discussed returning to the ED immediately if new or worsening sx occur. We discussed the sx which are most concerning (e.g., sudden worsening pain, fever, inability to tolerate by mouth, cauda equina s/sx) that necessitate immediate return. Medications administered to the patient during their visit and any new prescriptions provided to the patient are listed below.  Medications given during this visit Medications  acetaminophen (TYLENOL) tablet 1,000 mg  (has no administration in time range)  ketorolac (TORADOL) injection 15 mg (has no administration in time range)  HYDROmorphone (DILAUDID) injection 0.5 mg (has no administration in time range)  diazepam (VALIUM) tablet 5 mg (has no administration in time range)  oxyCODONE-acetaminophen (PERCOCET/ROXICET) 5-325 MG per tablet 1 tablet (1 tablet Oral Given 04/29/20 2009)     The patient appears reasonably screen and/or stabilized for discharge and I doubt any other medical condition or other [facility] requiring further screening, evaluation, or treatment in the ED at this time prior to discharge.   Final Clinical Impression(s) / ED Diagnoses Final diagnoses:  Acute bilateral low back pain with bilateral sciatica    Rx / DC Orders ED Discharge Orders    None       Melene Plan, DO 04/30/20 4163

## 2021-05-01 ENCOUNTER — Other Ambulatory Visit: Payer: Self-pay

## 2021-05-01 ENCOUNTER — Emergency Department (HOSPITAL_COMMUNITY)
Admission: EM | Admit: 2021-05-01 | Discharge: 2021-05-02 | Disposition: A | Discharge status: Home / Self Care | Payer: 59 | Attending: Emergency Medicine | Admitting: Emergency Medicine

## 2021-05-01 ENCOUNTER — Encounter (HOSPITAL_COMMUNITY): Payer: Self-pay

## 2021-05-01 ENCOUNTER — Emergency Department (HOSPITAL_COMMUNITY): Payer: 59

## 2021-05-01 DIAGNOSIS — N23 Unspecified renal colic: Secondary | ICD-10-CM | POA: Diagnosis not present

## 2021-05-01 DIAGNOSIS — I10 Essential (primary) hypertension: Secondary | ICD-10-CM | POA: Diagnosis not present

## 2021-05-01 DIAGNOSIS — F17210 Nicotine dependence, cigarettes, uncomplicated: Secondary | ICD-10-CM | POA: Insufficient documentation

## 2021-05-01 DIAGNOSIS — E119 Type 2 diabetes mellitus without complications: Secondary | ICD-10-CM | POA: Insufficient documentation

## 2021-05-01 DIAGNOSIS — N200 Calculus of kidney: Secondary | ICD-10-CM | POA: Insufficient documentation

## 2021-05-01 DIAGNOSIS — R109 Unspecified abdominal pain: Secondary | ICD-10-CM | POA: Diagnosis present

## 2021-05-01 LAB — BASIC METABOLIC PANEL
Anion gap: 12 (ref 5–15)
BUN: 17 mg/dL (ref 6–20)
CO2: 24 mmol/L (ref 22–32)
Calcium: 9.5 mg/dL (ref 8.9–10.3)
Chloride: 105 mmol/L (ref 98–111)
Creatinine, Ser: 1.41 mg/dL — ABNORMAL HIGH (ref 0.61–1.24)
GFR, Estimated: 60 mL/min — ABNORMAL LOW (ref >60)
Glucose, Bld: 263 mg/dL — ABNORMAL HIGH (ref 70–99)
Potassium: 3.9 mmol/L (ref 3.5–5.1)
Sodium: 141 mmol/L (ref 135–145)

## 2021-05-01 LAB — CBC WITH DIFFERENTIAL/PLATELET
Abs Immature Granulocytes: 0.08 10*3/uL — ABNORMAL HIGH (ref 0.00–0.07)
Basophils Absolute: 0.1 10*3/uL (ref 0.0–0.1)
Basophils Relative: 1 %
Eosinophils Absolute: 0.2 10*3/uL (ref 0.0–0.5)
Eosinophils Relative: 2 %
HCT: 52.5 % — ABNORMAL HIGH (ref 39.0–52.0)
Hemoglobin: 17.4 g/dL — ABNORMAL HIGH (ref 13.0–17.0)
Immature Granulocytes: 1 %
Lymphocytes Relative: 21 %
Lymphs Abs: 2.3 10*3/uL (ref 0.7–4.0)
MCH: 30.1 pg (ref 26.0–34.0)
MCHC: 33.1 g/dL (ref 30.0–36.0)
MCV: 90.7 fL (ref 80.0–100.0)
Monocytes Absolute: 1 10*3/uL (ref 0.1–1.0)
Monocytes Relative: 9 %
Neutro Abs: 7.3 10*3/uL (ref 1.7–7.7)
Neutrophils Relative %: 66 %
Platelets: 302 10*3/uL (ref 150–400)
RBC: 5.79 MIL/uL (ref 4.22–5.81)
RDW: 13.4 % (ref 11.5–15.5)
WBC: 11 10*3/uL — ABNORMAL HIGH (ref 4.0–10.5)
nRBC: 0 % (ref 0.0–0.2)

## 2021-05-01 LAB — URINALYSIS, MICROSCOPIC (REFLEX)
Bacteria, UA: NONE SEEN
Squamous Epithelial / HPF: NONE SEEN (ref 0–5)

## 2021-05-01 LAB — URINALYSIS, ROUTINE W REFLEX MICROSCOPIC
Bilirubin Urine: NEGATIVE
Glucose, UA: >=500 mg/dL — AB
Ketones, ur: NEGATIVE mg/dL
Leukocytes,Ua: NEGATIVE
Nitrite: NEGATIVE
Protein, ur: NEGATIVE mg/dL
Specific Gravity, Urine: 1.025 (ref 1.005–1.030)
pH: 6 (ref 5.0–8.0)

## 2021-05-01 MED ORDER — KETOROLAC TROMETHAMINE 30 MG/ML IJ SOLN
30.0000 mg | Freq: Once | INTRAMUSCULAR | Status: AC
  Administered 2021-05-01: 30 mg via INTRAVENOUS
  Filled 2021-05-01: qty 1

## 2021-05-01 MED ORDER — SODIUM CHLORIDE 0.9 % IV BOLUS
1000.0000 mL | Freq: Once | INTRAVENOUS | Status: AC
Start: 2021-05-01 — End: 2021-05-02
  Administered 2021-05-01: 1000 mL via INTRAVENOUS

## 2021-05-01 MED ORDER — PHENAZOPYRIDINE HCL 100 MG PO TABS
100.0000 mg | ORAL_TABLET | Freq: Once | ORAL | Status: AC
  Administered 2021-05-02: 100 mg via ORAL
  Filled 2021-05-01: qty 1

## 2021-05-01 MED ORDER — FENTANYL CITRATE PF 50 MCG/ML IJ SOSY
50.0000 ug | PREFILLED_SYRINGE | Freq: Once | INTRAMUSCULAR | Status: AC
  Administered 2021-05-01: 50 ug via INTRAVENOUS
  Filled 2021-05-01: qty 1

## 2021-05-01 MED ORDER — ONDANSETRON HCL 4 MG/2ML IJ SOLN
4.0000 mg | Freq: Once | INTRAMUSCULAR | Status: AC
  Administered 2021-05-01: 4 mg via INTRAVENOUS
  Filled 2021-05-01: qty 2

## 2021-05-01 NOTE — ED Provider Notes (Signed)
Emergency Medicine Provider Triage Evaluation Note  Mark Flowers , a 55 y.o. male  was evaluated in triage.  Pt complains of right flank pain radiating down into his sacral area.  States that he has had pain over the past 5 weeks however it has gotten a lot worse.  History of kidney stones.  No bowel bladder dysfunction.  Review of Systems  Positive: Flank pain, some dysuria Negative: Hematuria, bowel/bladder dfx  Physical Exam  BP (!) 143/100   Pulse 92   Temp 98 F (36.7 C) (Oral)   Resp 18   SpO2 99%  Gen:   Awake, writhing in pain. Resp:  Normal effort  MSK:   Moves extremities without difficulty  Other:  No CVA tenderness  Medical Decision Making  Medically screening exam initiated at 5:24 PM.  Appropriate orders placed.  Mark Flowers was informed that the remainder of the evaluation will be completed by another provider, this initial triage assessment does not replace that evaluation, and the importance of remaining in the ED until their evaluation is complete.    Mark Flowers 05/01/21 1731    Cheryll Cockayne, MD 05/06/21 [phone]

## 2021-05-01 NOTE — ED Provider Notes (Signed)
[facility]-EMERGENCY DEPT Provider Note   CSN: [id number] Arrival date & time: 05/01/21  1709     History Chief Complaint  Patient presents with   Back Pain   Right Leg Pain    Mark Flowers is a 55 y.o. male.  Patient with history of kidney stones, prior back surgery, reporting right sided flank pain radiating down to his scrotum. Pain has been intermittent over the last 5 weeks, and patient has recently passed two kidney stones. Patient also reporting burning discomfort with urination. No recent injury to back. Denies lower extremity weakness and bowel/bladder dysfunction.   The history is provided by the patient.  Flank Pain This is a recurrent problem. The current episode started more than 1 week ago. The problem occurs every several days. The problem has been gradually worsening.      Past Medical History:  Diagnosis Date   Degenerative disc disease    Diabetes mellitus    Hypertension    Kidney calculi    Kidney stones     There are no problems to display for this patient.   Past Surgical History:  Procedure Laterality Date   APPENDECTOMY     BACK SURGERY     BALLOON ANGIOPLASTY, ARTERY     CHOLECYSTECTOMY     KNEE SURGERY Bilateral        History reviewed. No pertinent family history.  Social History   Tobacco Use   Smoking status: Every Day    Packs/day: 1.00    Types: Cigarettes   Smokeless tobacco: Never  Vaping Use   Vaping Use: Never used  Substance Use Topics   Alcohol use: No   Drug use: No    Home Medications Prior to Admission medications   Medication Sig Start Date End Date Taking? Authorizing Provider  acetaminophen (TYLENOL) 500 MG tablet Take 500 mg by mouth every 6 (six) hours as needed for moderate pain.   Yes [provider]  DULoxetine (CYMBALTA) 30 MG capsule Take 30 mg by mouth daily. 03/14/21  Yes [provider]  gabapentin (NEURONTIN) 300 MG capsule Take 300 mg by mouth 3 (three)  times daily. 03/21/21  Yes [provider]  ibuprofen (ADVIL) 200 MG tablet Take 200 mg by mouth every 6 (six) hours as needed for moderate pain.   Yes [provider]  OZEMPIC, 0.25 OR 0.5 MG/DOSE, 2 MG/1.5ML SOPN Inject 0.25 mg into the skin once a week. On Fridays 03/28/21  Yes [provider]  cephALEXin (KEFLEX) 500 MG capsule Take 1 capsule (500 mg total) by mouth 4 (four) times daily. Patient not taking: No sig reported 11/18/15   Bethann Berkshire, MD  HYDROcodone-acetaminophen (NORCO/VICODIN) 5-325 MG tablet Take 1 tablet by mouth every 6 (six) hours as needed for moderate pain or severe pain. Patient not taking: No sig reported 04/13/18   Mardella Layman, MD  ondansetron (ZOFRAN) 4 MG tablet Take 1 tablet (4 mg total) by mouth every 6 (six) hours. Patient not taking: Reported on 05/01/2021 11/18/15   Bethann Berkshire, MD  orphenadrine (NORFLEX) 100 MG tablet Take 1 tablet (100 mg total) by mouth 2 (two) times daily. Patient not taking: Reported on 05/01/2021 03/04/18   Arby Barrette, MD  oxyCODONE-acetaminophen (PERCOCET/ROXICET) 5-325 MG tablet Take 1-2 tablets by mouth every 4 (four) hours as needed for severe pain. Patient not taking: Reported on 05/01/2021 07/25/18   Raeford Razor, MD    Allergies    Bee venom, Morphine and related, and  Flexeril [cyclobenzaprine hcl]  Review of Systems   Review of Systems  Constitutional:  Negative for fever.  Genitourinary:  Positive for flank pain.  All other systems reviewed and are negative.  Physical Exam Updated Vital Signs BP (!) 137/111   Pulse (!) 102   Temp 98.2 F (36.8 C) (Oral)   Resp 18   SpO2 100%   Physical Exam Constitutional:      Appearance: Normal appearance.  HENT:     Head: Normocephalic.     Mouth/Throat:     Mouth: Mucous membranes are moist.  Eyes:     Conjunctiva/sclera: Conjunctivae normal.  Cardiovascular:     Rate and Rhythm: Normal rate.  Pulmonary:     Effort: Pulmonary effort is  normal.  Abdominal:     Tenderness: There is no guarding or rebound.  Musculoskeletal:        General: Normal range of motion.  Skin:    General: Skin is warm and dry.  Neurological:     Mental Status: He is alert and oriented to person, place, and time.  Psychiatric:        Mood and Affect: Mood normal.    ED Results / Procedures / Treatments   Labs (all labs ordered are listed, but only abnormal results are displayed) Labs Reviewed  URINALYSIS, ROUTINE W REFLEX MICROSCOPIC - Abnormal; Notable for the following components:      Result Value   Glucose, UA >=500 (*)    Hgb urine dipstick LARGE (*)    All other components within normal limits  CBC WITH DIFFERENTIAL/PLATELET - Abnormal; Notable for the following components:   WBC 11.0 (*)    Hemoglobin 17.4 (*)    HCT 52.5 (*)    Abs Immature Granulocytes 0.08 (*)    All other components within normal limits  BASIC METABOLIC PANEL - Abnormal; Notable for the following components:   Glucose, Bld 263 (*)    Creatinine, Ser 1.41 (*)    GFR, Estimated 60 (*)    All other components within normal limits  URINALYSIS, MICROSCOPIC (REFLEX)    EKG None  Radiology CT Renal Stone Study  Result Date: 05/01/2021 CLINICAL DATA:  Left groin pain EXAM: CT ABDOMEN AND PELVIS WITHOUT CONTRAST TECHNIQUE: Multidetector CT imaging of the abdomen and pelvis was performed following the standard protocol without IV contrast. COMPARISON:  CT 07/25/2018 FINDINGS: Lower chest: Lung bases demonstrate no acute consolidation or effusion. Normal cardiac size. Hepatobiliary: No focal liver abnormality is seen. Status post cholecystectomy. No biliary dilatation. Pancreas: Unremarkable. No pancreatic ductal dilatation or surrounding inflammatory changes. Spleen: Normal in size without focal abnormality. Adrenals/Urinary Tract: Adrenal glands are normal. Multiple intrarenal stones. Subcentimeter hypodense lesion mid left kidney too small to further characterize.  No hydronephrosis or ureteral stone. The bladder is normal Stomach/Bowel: Stomach is within normal limits. Appendix appears normal. No evidence of bowel wall thickening, distention, or inflammatory changes. Vascular/Lymphatic: Mild aortic atherosclerosis. No aneurysm. No suspicious nodes Reproductive: Prostate is unremarkable. Other: Negative for pelvic effusion or free air. Small fat containing left inguinal hernia Musculoskeletal: Posterior fusion rods and fixating screws L3 through L5. No acute osseous abnormality IMPRESSION: 1. Bilateral nephrolithiasis without hydronephrosis or ureteral stone. 2. No CT evidence for acute intra-abdominal or pelvic abnormality Electronically Signed   By: Jasmine Pang M.D.   On: 05/01/2021 18:00    Procedures Procedures   Medications Ordered in ED Medications  phenazopyridine (PYRIDIUM) tablet 100 mg (has no administration in time range)  ketorolac (  TORADOL) 30 MG/ML injection 30 mg (30 mg Intravenous Given 05/01/21 2135)  sodium chloride 0.9 % bolus 1,000 mL (1,000 mLs Intravenous New Bag/Given 05/01/21 2231)  ondansetron (ZOFRAN) injection 4 mg (4 mg Intravenous Given 05/01/21 2229)  fentaNYL (SUBLIMAZE) injection 50 mcg (50 mcg Intravenous Given 05/01/21 2306)    ED Course  I have reviewed the triage vital signs and the nursing notes.  Pertinent labs & imaging results that were available during my care of the patient were reviewed by me and considered in my medical decision making (see chart for details).    MDM Rules/Calculators/A&P                           Patient discussed with Dr. Wilkie Aye.  Pt has been diagnosed with a Kidney Stone via CT. There is no evidence of significant hydronephrosis, serum creatine slightly elevated (fluid bolus given), vitals sign stable and the pt does not have irratractable vomiting. Pt will be dc home with pain medications & has been advised to follow up with PCP and urology.   Final Clinical Impression(s) / ED  Diagnoses Final diagnoses:  Kidney stone  Renal colic on right side    Rx / DC Orders ED Discharge Orders          Ordered    ondansetron (ZOFRAN ODT) 4 MG disintegrating tablet        05/02/21 0009    oxyCODONE-acetaminophen (PERCOCET/ROXICET) 5-325 MG tablet  Every 6 hours PRN        05/02/21 0009    phenazopyridine (PYRIDIUM) 200 MG tablet  3 times daily        05/02/21 0009             Felicie Morn, NP 05/02/21 0042    Wilkie Aye, Clabe Seal, DO 05/02/21 1745

## 2021-05-01 NOTE — ED Triage Notes (Signed)
Pt states that for the last few days he has had back pain that radiates down his left leg and into his groin.

## 2021-05-02 MED ORDER — OXYCODONE-ACETAMINOPHEN 5-325 MG PO TABS: 1.0000 | ORAL_TABLET | Freq: Four times a day (QID) | ORAL | 0 refills | Status: AC | PRN

## 2021-05-02 MED ORDER — ONDANSETRON 4 MG PO TBDP: ORAL_TABLET | ORAL | 0 refills | Status: AC

## 2021-05-02 MED ORDER — PHENAZOPYRIDINE HCL 200 MG PO TABS: 200.0000 mg | ORAL_TABLET | Freq: Three times a day (TID) | ORAL | 0 refills | Status: AC

## 2021-05-02 NOTE — Discharge Instructions (Addendum)
Please refer to the attached instructions. Follow-up with urology as discussed.

## 2022-05-30 IMAGING — CT CT RENAL STONE PROTOCOL
2 of 4 series · 17 of 46 positions shown, 19 images · non-contrast
Comparison: CT 07/25/2018

CLINICAL DATA: Left groin pain

EXAM:
CT ABDOMEN AND PELVIS WITHOUT CONTRAST
TECHNIQUE: Multidetector CT imaging of the abdomen and pelvis was performed
following the standard protocol without IV contrast.

[Series 2: axial st · axial · 0.96mm/px · z∈[+993,+1443]mm · 14 of 102 slices shown, 16 images]
[im 6/102  soft-tissue]
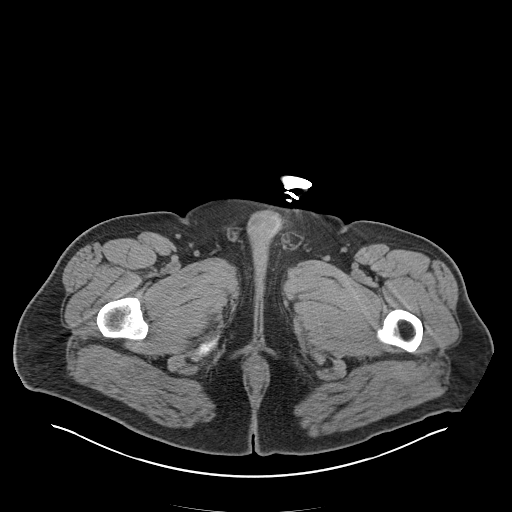
[im 6/102  bone]
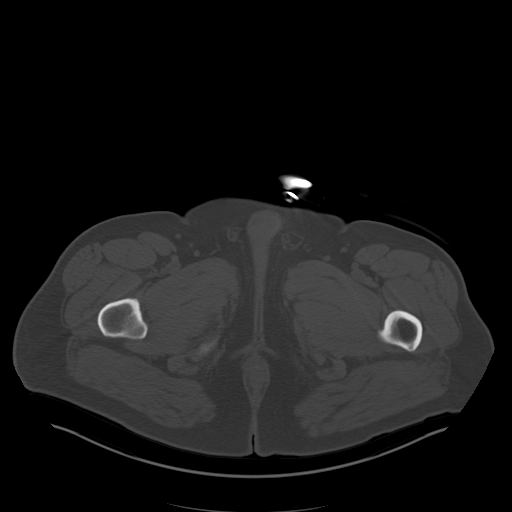
[im 16/102  soft-tissue]
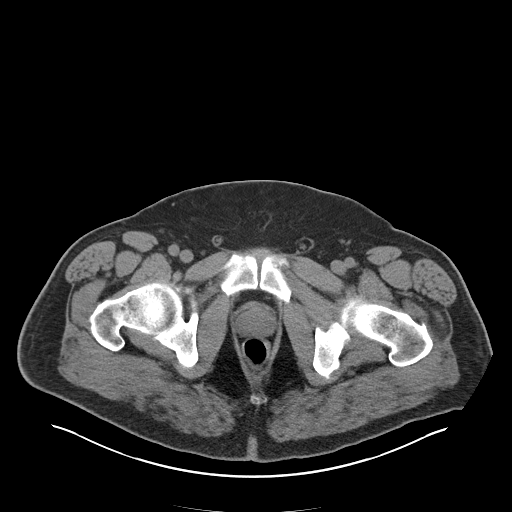
[im 21/102  soft-tissue]
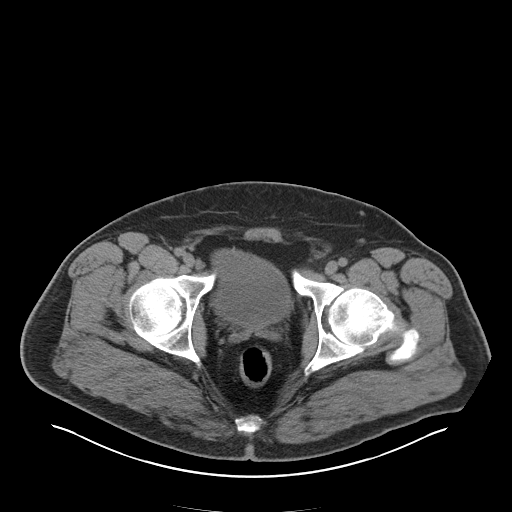
[im 26/102  soft-tissue]
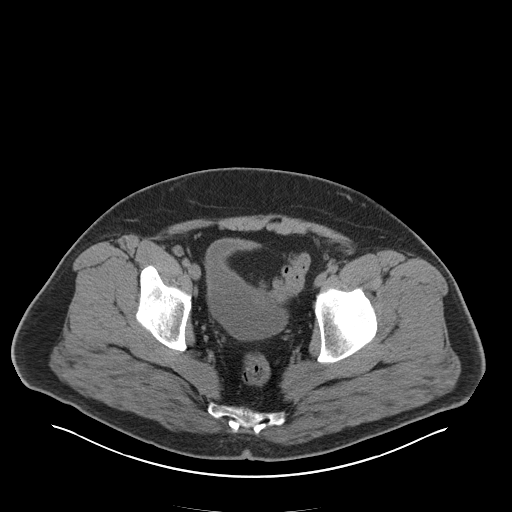
[im 36/102  soft-tissue]
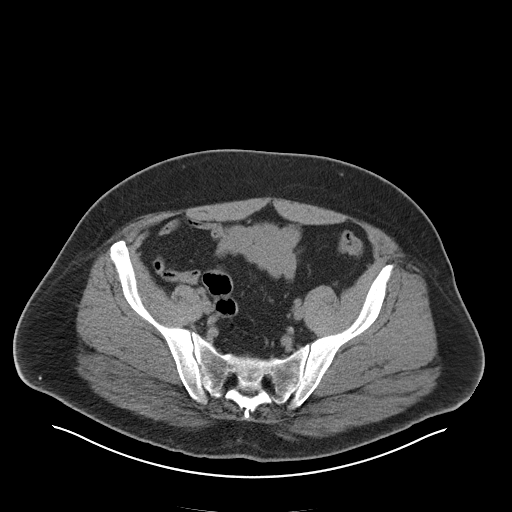
[im 41/102  soft-tissue]
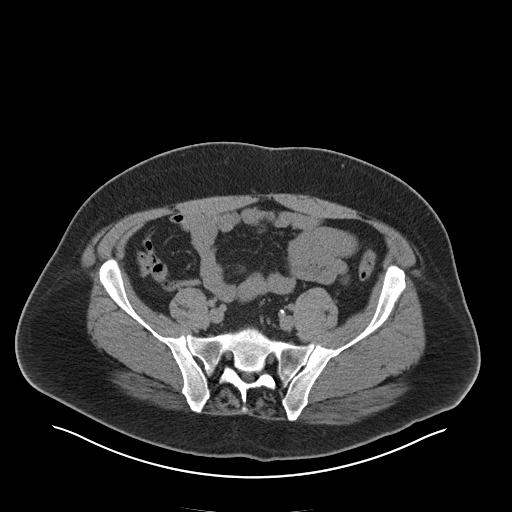
[im 46/102  soft-tissue]
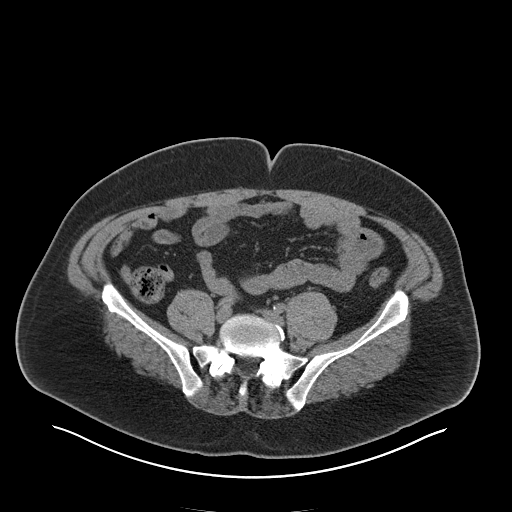
[im 56/102  soft-tissue]
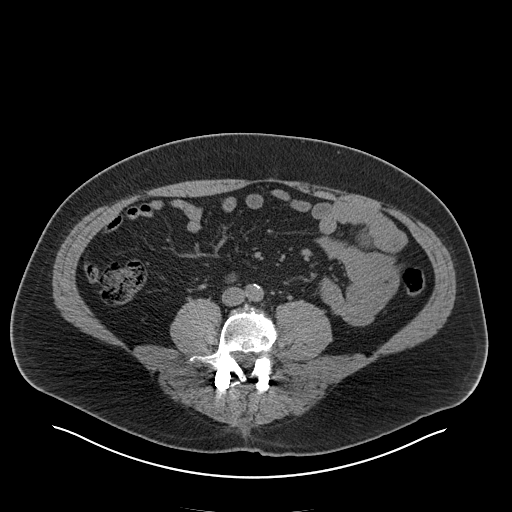
[im 61/102  soft-tissue]
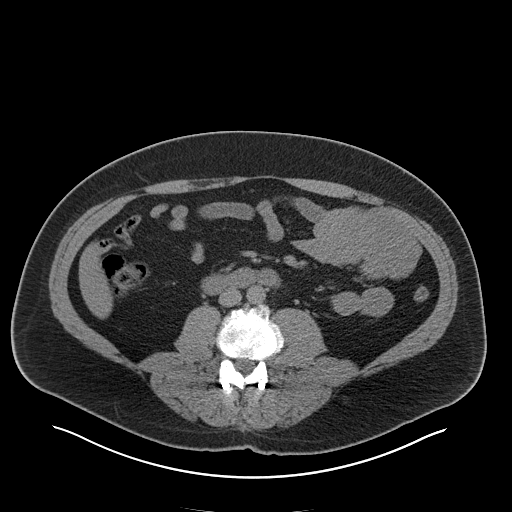
[im 61/102  bone]
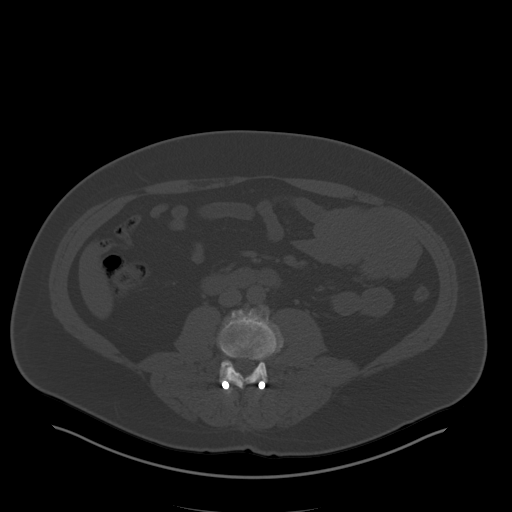
[im 66/102  soft-tissue]
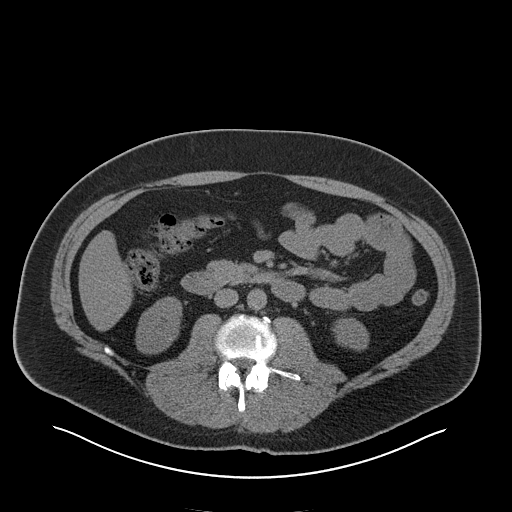
[im 76/102  soft-tissue]
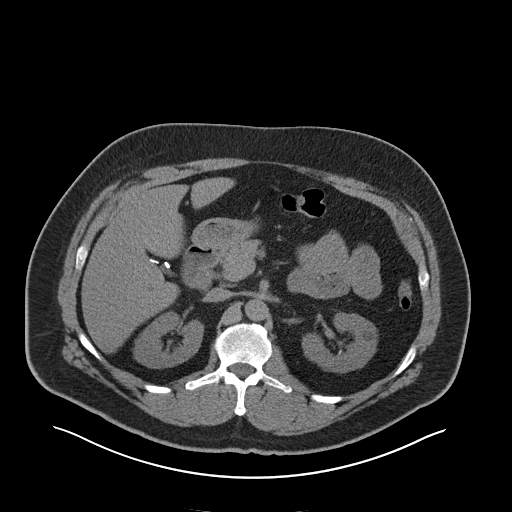
[im 81/102  soft-tissue]
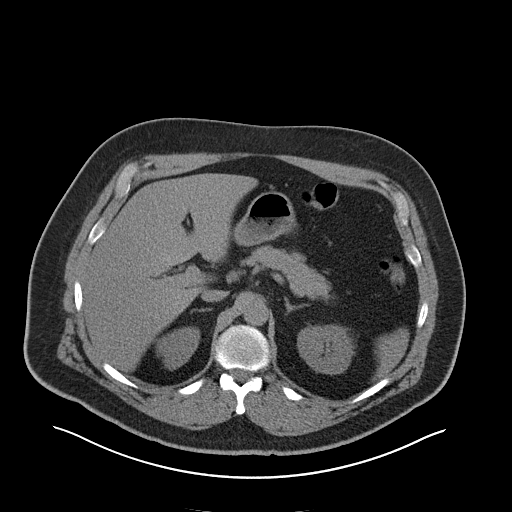
[im 86/102  soft-tissue]
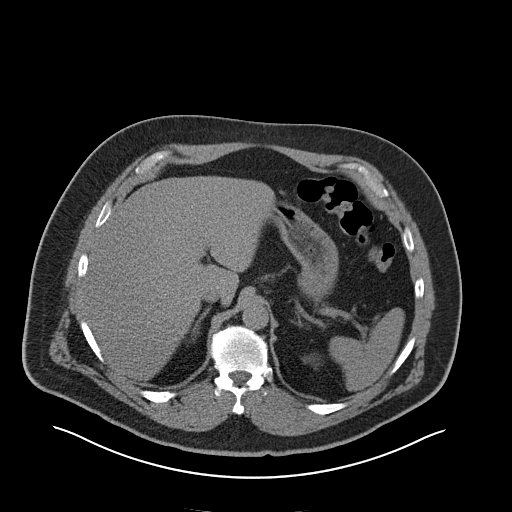
[im 96/102  soft-tissue]
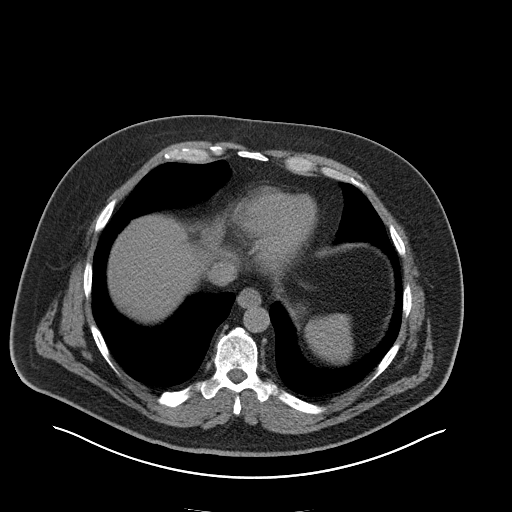

[Series 4: coronal · coronal · 1.02mm/px · 3 of 169 slices shown]
[im 57/169  soft-tissue]
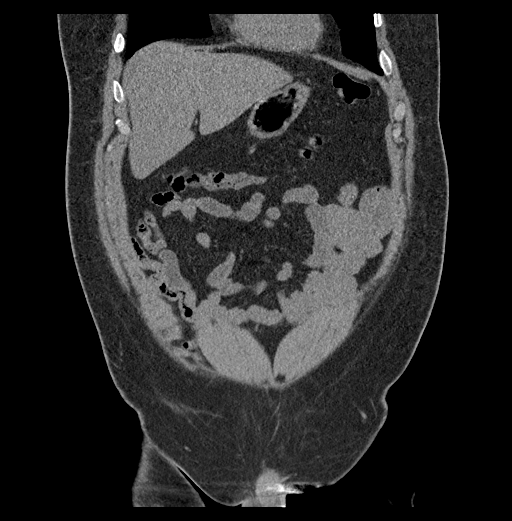
[im 75/169  soft-tissue]
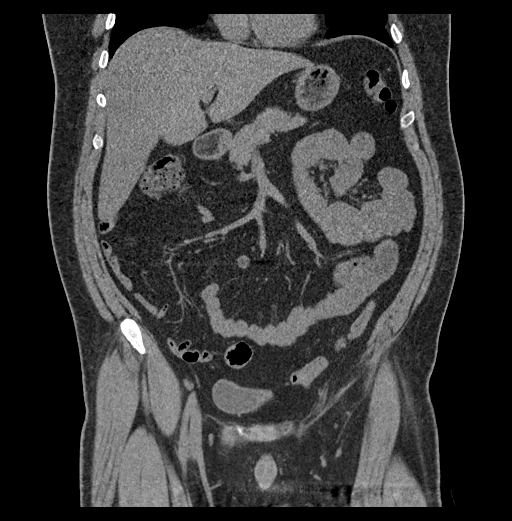
[im 94/169  soft-tissue]
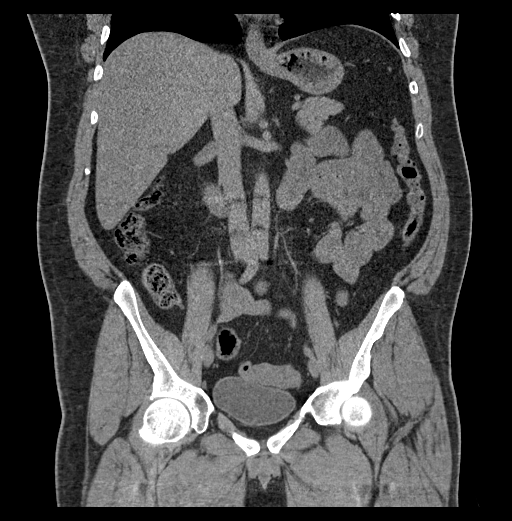

[17 of 46 positions shown; findings below may reference images not displayed]

FINDINGS: Lower chest: Lung bases demonstrate no acute consolidation or
effusion. Normal cardiac size.

Hepatobiliary: No focal liver abnormality is seen. Status post
cholecystectomy. No biliary dilatation.

Pancreas: Unremarkable. No pancreatic ductal dilatation or
surrounding inflammatory changes.

Spleen: Normal in size without focal abnormality.

Adrenals/Urinary Tract: Adrenal glands are normal. Multiple
intrarenal stones. Subcentimeter hypodense lesion mid left kidney
too small to further characterize. No hydronephrosis or ureteral
stone. The bladder is normal

Stomach/Bowel: Stomach is within normal limits. Appendix appears
normal. No evidence of bowel wall thickening, distention, or
inflammatory changes.

Vascular/Lymphatic: Mild aortic atherosclerosis. No aneurysm. No
suspicious nodes

Reproductive: Prostate is unremarkable.

Other: Negative for pelvic effusion or free air. Small fat
containing left inguinal hernia

Musculoskeletal: Posterior fusion rods and fixating screws L3
through L5. No acute osseous abnormality
IMPRESSION: 1. Bilateral nephrolithiasis without hydronephrosis or ureteral
stone.
2. No CT evidence for acute intra-abdominal or pelvic abnormality
